# Patient Record
Sex: Female | Born: 1985 | Race: White | Hispanic: No | Marital: Married | Smoking: Current every day smoker
Health system: Southern US, Community
[De-identification: ages and names within clinical notes are randomized; demographics above are authoritative.]

## PROBLEM LIST (undated history)

## (undated) DIAGNOSIS — T7840XA Allergy, unspecified, initial encounter: Secondary | ICD-10-CM

## (undated) DIAGNOSIS — R51 Headache: Secondary | ICD-10-CM

## (undated) DIAGNOSIS — Z87442 Personal history of urinary calculi: Secondary | ICD-10-CM

## (undated) DIAGNOSIS — R519 Headache, unspecified: Secondary | ICD-10-CM

## (undated) DIAGNOSIS — F419 Anxiety disorder, unspecified: Secondary | ICD-10-CM

## (undated) DIAGNOSIS — Q7649 Other congenital malformations of spine, not associated with scoliosis: Secondary | ICD-10-CM

## (undated) HISTORY — PX: OTHER SURGICAL HISTORY: SHX169

## (undated) HISTORY — DX: Anxiety disorder, unspecified: F41.9

## (undated) HISTORY — DX: Allergy, unspecified, initial encounter: T78.40XA

---

## 2014-12-04 ENCOUNTER — Encounter: Payer: Self-pay | Admitting: Urgent Care

## 2014-12-04 ENCOUNTER — Emergency Department
Admission: EM | Admit: 2014-12-04 | Discharge: 2014-12-04 | Disposition: A | Discharge status: Home / Self Care | Attending: Emergency Medicine | Admitting: Emergency Medicine

## 2014-12-04 DIAGNOSIS — Q763 Congenital scoliosis due to congenital bony malformation: Secondary | ICD-10-CM | POA: Diagnosis not present

## 2014-12-04 DIAGNOSIS — Z720 Tobacco use: Secondary | ICD-10-CM | POA: Diagnosis not present

## 2014-12-04 DIAGNOSIS — R112 Nausea with vomiting, unspecified: Secondary | ICD-10-CM | POA: Diagnosis not present

## 2014-12-04 DIAGNOSIS — M545 Low back pain, unspecified: Secondary | ICD-10-CM

## 2014-12-04 DIAGNOSIS — R51 Headache: Secondary | ICD-10-CM | POA: Insufficient documentation

## 2014-12-04 DIAGNOSIS — Z760 Encounter for issue of repeat prescription: Secondary | ICD-10-CM | POA: Diagnosis present

## 2014-12-04 HISTORY — DX: Other congenital malformations of spine, not associated with scoliosis: Q76.49

## 2014-12-04 MED ORDER — HYDROCODONE-ACETAMINOPHEN 5-325 MG PO TABS: 1.0000 | ORAL_TABLET | ORAL | Status: DC | PRN

## 2014-12-04 MED ORDER — HYDROCODONE-ACETAMINOPHEN 5-325 MG PO TABS
2.0000 | ORAL_TABLET | Freq: Once | ORAL | Status: AC
Start: 2014-12-04 — End: 2014-12-04
  Administered 2014-12-04: 2 via ORAL
  Filled 2014-12-04: qty 2

## 2014-12-04 NOTE — Discharge Instructions (Signed)
Back Pain, Adult Low back pain is very common. About 1 in 5 people have back pain.The cause of low back pain is rarely dangerous. The pain often gets better over time.About half of people with a sudden onset of back pain feel better in just 2 weeks. About 8 in 10 people feel better by 6 weeks.  CAUSES Some common causes of back pain include:  Strain of the muscles or ligaments supporting the spine.  Wear and tear (degeneration) of the spinal discs.  Arthritis.  Direct injury to the back. DIAGNOSIS Most of the time, the direct cause of low back pain is not known.However, back pain can be treated effectively even when the exact cause of the pain is unknown.Answering your caregiver's questions about your overall health and symptoms is one of the most accurate ways to make sure the cause of your pain is not dangerous. If your caregiver needs more information, he or she may order lab work or imaging tests (X-rays or MRIs).However, even if imaging tests show changes in your back, this usually does not require surgery. HOME CARE INSTRUCTIONS For many people, back pain returns.Since low back pain is rarely dangerous, it is often a condition that people can learn to manageon their own.   Remain active. It is stressful on the back to sit or stand in one place. Do not sit, drive, or stand in one place for more than 30 minutes at a time. Take short walks on level surfaces as soon as pain allows.Try to increase the length of time you walk each day.  Do not stay in bed.Resting more than 1 or 2 days can delay your recovery.  Do not avoid exercise or work.Your body is made to move.It is not dangerous to be active, even though your back may hurt.Your back will likely heal faster if you return to being active before your pain is gone.  Pay attention to your body when you bend and lift. Many people have less discomfortwhen lifting if they bend their knees, keep the load close to their bodies,and  avoid twisting. Often, the most comfortable positions are those that put less stress on your recovering back.  Find a comfortable position to sleep. Use a firm mattress and lie on your side with your knees slightly bent. If you lie on your back, put a pillow under your knees.  Only take over-the-counter or prescription medicines as directed by your caregiver. Over-the-counter medicines to reduce pain and inflammation are often the most helpful.Your caregiver may prescribe muscle relaxant drugs.These medicines help dull your pain so you can more quickly return to your normal activities and healthy exercise.  Put ice on the injured area.  Put ice in a plastic bag.  Place a towel between your skin and the bag.  Leave the ice on for 15-20 minutes, 03-04 times a day for the first 2 to 3 days. After that, ice and heat may be alternated to reduce pain and spasms.  Ask your caregiver about trying back exercises and gentle massage. This may be of some benefit.  Avoid feeling anxious or stressed.Stress increases muscle tension and can worsen back pain.It is important to recognize when you are anxious or stressed and learn ways to manage it.Exercise is a great option. SEEK MEDICAL CARE IF:  You have pain that is not relieved with rest or medicine.  You have pain that does not improve in 1 week.  You have new symptoms.  You are generally not feeling well. SEEK   IMMEDIATE MEDICAL CARE IF:   You have pain that radiates from your back into your legs.  You develop new bowel or bladder control problems.  You have unusual weakness or numbness in your arms or legs.  You develop nausea or vomiting.  You develop abdominal pain.  You feel faint. Document Released: 03/09/2005 Document Revised: 09/08/2011 Document Reviewed: 07/11/2013 Exit[employer] Patient Information 2015 [employer]. This information is not intended to replace advice given to you by your health care provider. Make sure you  discuss any questions you have with your health care provider.  

## 2014-12-04 NOTE — ED Provider Notes (Signed)
[facility] Emergency Department Provider Note ____________________________________________  Time seen: Approximately 9:29 PM  I have reviewed the triage vital signs and the nursing notes.   HISTORY  Chief Complaint Medication Refill   HPI Kerri Fuentes is a 29 y.o. female presenting to the ED today for medication refill. She states that she takes hydrocodone-acetaminophen 10-325 mg 5 times a dayfor an established diagnosis of Bertolotti's syndrome. She may care from [place] 2 weeks ago and states that she has not yet established care. She states that she saw Dr. Leonette Monarch a spine specialist at [facility] who said that she would need to be referred to orthopedics. She also saw a PA in primary care who stated that she would need to come to the emergency department for her medication refill. She states she has been out of her medications for 2 days and has had 3 episodes of emesis today. She reports headache but denies fever, sweats, shaking or tremors. Her primary complaint today is lower back and bilateral hip pain which she rates at 8/10. She states she has tried Advil and Tylenol over-the-counter with no relief.   Past Medical History  Diagnosis Date  . Bertolotti's syndrome     There are no active problems to display for this patient.   History reviewed. No pertinent past surgical history.  Current Outpatient Rx  Name  Route  Sig  Dispense  Refill  . HYDROcodone-acetaminophen (NORCO) 5-325 MG per tablet   Oral   Take 1-2 tablets by mouth every 4 (four) hours as needed for moderate pain.   15 tablet   0     Allergies Other and Ultram  No family history on file.  Social History Social History  Substance Use Topics  . Smoking status: Current Every Day Smoker  . Smokeless tobacco: None  . Alcohol Use: No    Review of Systems Constitutional: No fever/chills Eyes: No visual changes. ENT: No sore throat. Cardiovascular: Denies chest  pain. Respiratory: Denies shortness of breath. Gastrointestinal: No abdominal pain.  Reports nausea and vomiting.  No diarrhea.  No constipation. Genitourinary: Negative for dysuria. Musculoskeletal: Positive for back pain. Skin: Negative for rash. Neurological: Positive for headache. Negative for focal weakness or numbness.  10-point ROS otherwise negative.  ____________________________________________   PHYSICAL EXAM:  VITAL SIGNS: ED Triage Vitals  Enc Vitals Group     BP 12/04/14 1956 150/103 mmHg     Pulse Rate 12/04/14 1956 99     Resp 12/04/14 1956 18     Temp 12/04/14 1956 98.2 F (36.8 C)     Temp Source 12/04/14 1956 Oral     SpO2 12/04/14 1956 96 %     Weight 12/04/14 1956 127 lb (57.607 kg)     Height 12/04/14 1956 5\' 8"  (1.727 m)     Head Cir --      Peak Flow --      Pain Score 12/04/14 1956 9     Pain Loc --      Pain Edu? --      Excl. in GC? --     Constitutional: Alert and oriented. Well appearing and in no acute distress. Eyes: Conjunctivae are normal. PERRL. EOMI. Head: Atraumatic. Nose: No congestion/rhinnorhea. Mouth/Throat: Mucous membranes are moist.  Oropharynx non-erythematous. Neck: No stridor.   Cardiovascular: Normal rate, regular rhythm. Grossly normal heart sounds.  Good peripheral circulation. Respiratory: Normal respiratory effort.  No retractions. Lungs CTAB. Gastrointestinal: Soft and nontender. No distention. No abdominal bruits.  No CVA tenderness. Musculoskeletal: Tenderness to palpation of lumbar spine. ROM limited due to pain.  Neurologic:  Normal speech and language. No gross focal neurologic deficits are appreciated. No gait instability. Skin:  Skin is warm, dry and intact. No rash noted. Psychiatric: Mood and affect are normal. Speech and behavior are normal.  ____________________________________________   LABS (all labs ordered are listed, but only abnormal results are displayed)  Labs Reviewed - No data to  display ____________________________________________    PROCEDURES  Procedure(s) performed: None  Critical Care performed: No  ____________________________________________   INITIAL IMPRESSION / ASSESSMENT AND PLAN / ED COURSE  Pertinent labs & imaging results that were available during my care of the patient were reviewed by me and considered in my medical decision making (see chart for details).  Patient presents with establish diagnosis of Bertolotti's syndrome for pain medication refill. Provided with two tablets of Hydocodone-acetaminophen 5-325 mg in the ED which reduced pain and improved nausea. Provided with prescription for Hydocodone-acetaminophen 10-325 mg for several days and encouraged to regulate medication to avoid withdrawal symptoms.   ____________________________________________   FINAL CLINICAL IMPRESSION(S) / ED DIAGNOSES  Final diagnoses:  Back pain at L4-L5 level      Evangeline Dakin, PA-C 12/04/14 2246  Loleta Rose, MD 12/04/14 [phone]

## 2014-12-04 NOTE — ED Notes (Signed)
Patient presents with c/o lower back and RIGHT hip pain. Reports that she just moved "home from [place]". Patient with PMH significant for Bertolotti's syndrome. Patient advising that she does not have a doctor - has been to two clinics that have told her that she would have to come to the ED for her narcotics until everything transferred from [place]. Patient asking for a HYDROcodone Rx in triage.

## 2014-12-25 ENCOUNTER — Ambulatory Visit: Payer: Self-pay | Admitting: Pain Medicine

## 2014-12-27 ENCOUNTER — Ambulatory Visit: Attending: Pain Medicine | Admitting: Pain Medicine

## 2014-12-27 ENCOUNTER — Encounter: Payer: Self-pay | Admitting: Pain Medicine

## 2014-12-27 VITALS — BP 138/86 | HR 95 | Temp 98.4°F | Resp 16 | Ht 68.0 in | Wt 130.0 lb

## 2014-12-27 DIAGNOSIS — M5416 Radiculopathy, lumbar region: Secondary | ICD-10-CM

## 2014-12-27 DIAGNOSIS — M5136 Other intervertebral disc degeneration, lumbar region: Secondary | ICD-10-CM | POA: Diagnosis not present

## 2014-12-27 DIAGNOSIS — M545 Low back pain: Secondary | ICD-10-CM | POA: Diagnosis present

## 2014-12-27 DIAGNOSIS — Q7649 Other congenital malformations of spine, not associated with scoliosis: Secondary | ICD-10-CM | POA: Diagnosis not present

## 2014-12-27 DIAGNOSIS — M533 Sacrococcygeal disorders, not elsewhere classified: Secondary | ICD-10-CM | POA: Diagnosis not present

## 2014-12-27 DIAGNOSIS — M47816 Spondylosis without myelopathy or radiculopathy, lumbar region: Secondary | ICD-10-CM

## 2014-12-27 NOTE — Progress Notes (Signed)
Subjective:    Patient ID: Kerri Fuentes, female    DOB: Jun 24, 1985, 29 y.o.   MRN: [id number]  HPI  patient is 29 year old female who comes to pain management Center at the request of Dr. Sherril Cong for further evaluation and treatment of pain involving the lower back and lower extremity regions. The patient states that her pain began approximately 6 years ago following the birth of her first baby girl. The patient stated proximate 2 weeks later she did begin to develop lower back pain. The patient stated that she had epidural anesthesia and did not feel that the epidural cause any of her pain. The patient states that she began to walk with a limp following the birth of her second baby girl approximately 3 years ago. The patient described her pain as aching cramping D Bill exhausting feeling of constriction nagging pressure-like sensation and pulsating pain. The patient stated the pain is a sharp shooting stabbing and throbbing sensation that was very uncomfortable. The patient stated the pain becomes more intense as the day progresses. Patient stated the pain is increased with bending kneeling lifting motion standing walking. Patient stated the pain is decreased with lying down. The patient has undergone previous treatment in pain clinic after coming to Pain Management Center and has undergone interventional treatment including radiofrequency rhizolysis. We discussed patient's condition and informed patient that we will consider patient for interventional treatment consisting of lumbar facet, medial branch nerve, blocks at time return appointment. The patient is undergone surgical evaluation by Dr. Sherril Cong  Without plans for surgical intervention. We will observe response to the  Lumbar facet, medial branch nerve, blocks and consider patient for additional modification of treatment pending response to treatment and follow-up evaluation. The patient was understanding and in agreement with  suggested treatment plan.      Review of Systems    Cardiovascular  Unremarkable   Pulmonary  Unremarkable   Neurological  Unremarkable   Psychological  Anxiety   Gastrointestinal  Unremarkable   Genitourinary  Unremarkable   Hematological  Unremarkable   Endocrine  Unremarkable   Rheumatological  Unremarkable   Musculoskeletal  Unremarkable   Other significant  Unremarkable      Objective:   Physical Exam   there was tends to palpation of paraspinal muscles region cervical region cervical facet region a minimal degree. There was minimal tenderness over the trapezius levator scapula rhomboid musculature regions. There was minimal tinnitus of the acromioclavicular and glenohumeral joint regions. Palpation over the cervical facet cervical paraspinal musculature region and the thoracic facet thoracic paraspinal musculature region were with minimal tends to palpation. Patient appeared to be with unremarkable Spurling's maneuver with bilaterally equal grip strength. Tinel and Phalen's maneuver were without increased pain of significant degree. Palpation over the lower thoracic paraspinal musculature region thoracic facet region was without significant increase of pain. There was no crepitus of the thoracic region noted . Palpation over the lumbar paraspinal muscles region lumbar facet region was a tends to palpation of moderate to moderately severe degree  and palpation over the PSIS and PII S regions was with moderately severe degree with tenderness to palpation as well  Period there was increase of pain with pressure prior to the ilium with patient in lateral decubitus position. Straight leg raising was tolerates approximately 30 without increased pain with dorsiflexion noted. DTRs appear to be trace at the knees. There was negative clonus negative Homans. No definite sensory deficit of dermatomal distribution  was detected. Lateral bending and rotation and extension and  palpation of the lumbar facets reproduce predominant portion of patient's pain. As well as palpation of the PSIS and PII S regions reproduced severe discomfort. Patient was also with positive Patrick's maneuver. There was negative clonus negative Homans. Abdomen the abdomen was nontender with no costovertebral angle tenderness noted.      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 Clint Guy for evaliation of  BP and general medical  condition  F/U surgical evaluation. Patient will follow-up with Dr. Sherril Cong as discussed and may be considered for further surgical evaluation  F/U neurological evaluation. May consider PNCV/EMG studies pending follow-up evaluations  May consider radiofrequency rhizolysis or intraspinal procedures pending response to present treatment and F/U evaluation   Patient to call Pain Management Center should patient have concerns prior to scheduled return appointment.

## 2014-12-27 NOTE — Patient Instructions (Addendum)
PLAN   Continue present medication   Lumbar facet, medial branch nerve blocks to be performed at time of return appointment  F/U PCP Dr. Clint Guy for evaliation of  BP and general medical  condition  F/U surgical evaluation. May consider pending follow-up evaluations  F/U neurological evaluation. May consider PNCV/EMG studies and other studies pending follow-up evaluations  May consider radiofrequency rhizolysis or intraspinal procedures pending response to present treatment and F/U evaluation   Patient to call Pain Management Center should patient have concerns prior to scheduled return appointment. Do not get a flu shot 2 weeks before or after your injection. Facet Blocks Patient Information  Description: The facets are joints in the spine between the vertebrae.  Like any joints in the body, facets can become irritated and painful.  Arthritis can also effect the facets.  By injecting steroids and local anesthetic in and around these joints, we can temporarily block the nerve supply to them.  Steroids act directly on irritated nerves and tissues to reduce selling and inflammation which often leads to decreased pain.  Facet blocks may be done anywhere along the spine from the neck to the low back depending upon the location of your pain.   After numbing the skin with local anesthetic (like Novocaine), a small needle is passed onto the facet joints under x-ray guidance.  You may experience a sensation of pressure while this is being done.  The entire block usually lasts about 15-25 minutes.   Conditions which may be treated by facet blocks:   Low back/buttock pain  Neck/shoulder pain  Certain types of headaches  Preparation for the injection:  1. Do not eat any solid food or dairy products within 6 hours of your appointment. 2. You may drink clear liquid up to 2 hours before appointment.  Clear liquids include water, black coffee, juice or soda.  No milk or  cream please. 3. You may take your regular medication, including pain medications, with a sip of water before your appointment.  Diabetics should hold regular insulin (if taken separately) and take 1/2 normal NPH dose the morning of the procedure.  Carry some sugar containing items with you to your appointment. 4. A driver must accompany you and be prepared to drive you home after your procedure. 5. Bring all your current medications with you. 6. An IV may be inserted and sedation may be given at the discretion of the physician. 7. A blood pressure cuff, EKG and other monitors will often be applied during the procedure.  Some patients may need to have extra oxygen administered for a short period. 8. You will be asked to provide medical information, including your allergies and medications, prior to the procedure.  We must know immediately if you are taking blood thinners (like Coumadin/Warfarin) or if you are allergic to IV iodine contrast (dye).  We must know if you could possible be pregnant.  Possible side-effects:   Bleeding from needle site  Infection (rare, may require surgery)  Nerve injury (rare)  Numbness & tingling (temporary)  Difficulty urinating (rare, temporary)  Spinal headache (a headache worse with upright posture)  Light-headedness (temporary)  Pain at injection site (serveral days)  Decreased blood pressure (rare, temporary)  Weakness in arm/leg (temporary)  Pressure sensation in back/neck (temporary)   Call if you experience:   Fever/chills associated with headache or increased back/neck pain  Headache worsened by an upright position  New onset, weakness or  numbness of an extremity below the injection site  Hives or difficulty breathing (go to the emergency room)  Inflammation or drainage at the injection site(s)  Severe back/neck pain greater than usual  New symptoms which are concerning to you  Please note:  Although the local anesthetic  injected can often make your back or neck feel good for several hours after the injection, the pain will likely return. It takes 3-7 days for steroids to work.  You may not notice any pain relief for at least one week.  If effective, we will often do a series of 2-3 injections spaced 3-6 weeks apart to maximally decrease your pain.  After the initial series, you may be a candidate for a more permanent nerve block of the facets.  If you have any questions, please call #[phone] [facility]

## 2014-12-28 ENCOUNTER — Encounter: Payer: Self-pay | Admitting: Pain Medicine

## 2015-01-03 ENCOUNTER — Other Ambulatory Visit: Payer: Self-pay | Admitting: Pain Medicine

## 2015-01-09 ENCOUNTER — Telehealth: Payer: Self-pay | Admitting: Pain Medicine

## 2015-01-09 ENCOUNTER — Encounter: Payer: Self-pay | Admitting: Pain Medicine

## 2015-01-09 ENCOUNTER — Ambulatory Visit: Attending: Pain Medicine | Admitting: Pain Medicine

## 2015-01-09 VITALS — BP 103/80 | HR 60 | Temp 98.2°F | Resp 14 | Ht 68.0 in | Wt 132.0 lb

## 2015-01-09 DIAGNOSIS — M5136 Other intervertebral disc degeneration, lumbar region: Secondary | ICD-10-CM | POA: Diagnosis not present

## 2015-01-09 DIAGNOSIS — M79604 Pain in right leg: Secondary | ICD-10-CM | POA: Diagnosis present

## 2015-01-09 DIAGNOSIS — M1288 Other specific arthropathies, not elsewhere classified, other specified site: Secondary | ICD-10-CM | POA: Diagnosis not present

## 2015-01-09 DIAGNOSIS — M545 Low back pain: Secondary | ICD-10-CM | POA: Diagnosis present

## 2015-01-09 DIAGNOSIS — M5416 Radiculopathy, lumbar region: Secondary | ICD-10-CM

## 2015-01-09 DIAGNOSIS — M533 Sacrococcygeal disorders, not elsewhere classified: Secondary | ICD-10-CM

## 2015-01-09 DIAGNOSIS — M79605 Pain in left leg: Secondary | ICD-10-CM | POA: Diagnosis present

## 2015-01-09 DIAGNOSIS — M47816 Spondylosis without myelopathy or radiculopathy, lumbar region: Secondary | ICD-10-CM

## 2015-01-09 MED ORDER — ORPHENADRINE CITRATE 30 MG/ML IJ SOLN
INTRAMUSCULAR | Status: AC
  Administered 2015-01-09: 60 mg
  Filled 2015-01-09: qty 2

## 2015-01-09 MED ORDER — TRIAMCINOLONE ACETONIDE 40 MG/ML IJ SUSP
INTRAMUSCULAR | Status: AC
  Administered 2015-01-09: 40 mg
  Filled 2015-01-09: qty 1

## 2015-01-09 MED ORDER — BUPIVACAINE HCL (PF) 0.25 % IJ SOLN
INTRAMUSCULAR | Status: AC
  Filled 2015-01-09: qty 30

## 2015-01-09 MED ORDER — FENTANYL CITRATE (PF) 100 MCG/2ML IJ SOLN
INTRAMUSCULAR | Status: AC
  Administered 2015-01-09: 100 ug via INTRAVENOUS
  Filled 2015-01-09: qty 2

## 2015-01-09 MED ORDER — LIDOCAINE HCL (PF) 1 % IJ SOLN
INTRAMUSCULAR | Status: AC
  Filled 2015-01-09: qty 5

## 2015-01-09 MED ORDER — MIDAZOLAM HCL 2 MG/2ML IJ SOLN: 5.0000 mg | Freq: Once | INTRAMUSCULAR | Status: DC

## 2015-01-09 MED ORDER — LIDOCAINE HCL (PF) 1 % IJ SOLN: 10.0000 mL | Freq: Once | INTRAMUSCULAR | Status: DC

## 2015-01-09 MED ORDER — TRIAMCINOLONE ACETONIDE 40 MG/ML IJ SUSP: 40.0000 mg | Freq: Once | INTRAMUSCULAR | Status: DC

## 2015-01-09 MED ORDER — FENTANYL CITRATE (PF) 100 MCG/2ML IJ SOLN: 100.0000 ug | Freq: Once | INTRAMUSCULAR | Status: DC

## 2015-01-09 MED ORDER — MIDAZOLAM HCL 5 MG/5ML IJ SOLN
INTRAMUSCULAR | Status: AC
  Administered 2015-01-09: 5 mg via INTRAVENOUS
  Filled 2015-01-09: qty 5

## 2015-01-09 MED ORDER — SODIUM CHLORIDE 0.9 % IJ SOLN: 20.0000 mL | Freq: Once | INTRAMUSCULAR | Status: DC

## 2015-01-09 MED ORDER — ORPHENADRINE CITRATE 30 MG/ML IJ SOLN: 60.0000 mg | Freq: Once | INTRAMUSCULAR | Status: DC

## 2015-01-09 MED ORDER — BUPIVACAINE HCL (PF) 0.25 % IJ SOLN
INTRAMUSCULAR | Status: AC
  Administered 2015-01-09: 30 mL
  Filled 2015-01-09: qty 30

## 2015-01-09 MED ORDER — SODIUM CHLORIDE 0.9 % IJ SOLN
INTRAMUSCULAR | Status: AC
  Filled 2015-01-09: qty 20

## 2015-01-09 MED ORDER — BUPIVACAINE HCL (PF) 0.25 % IJ SOLN: 30.0000 mL | Freq: Once | INTRAMUSCULAR | Status: DC

## 2015-01-09 MED ORDER — LACTATED RINGERS IV SOLN: 1000.0000 mL | INTRAVENOUS | Status: DC

## 2015-01-09 NOTE — Progress Notes (Signed)
Safety precautions to be maintained throughout the outpatient stay will include: orient to surroundings, keep bed in low position, maintain call bell within reach at all times, provide assistance with transfer out of bed and ambulation.  

## 2015-01-09 NOTE — Patient Instructions (Addendum)
PLAN   Continue present medication  F/U PCP  Dr. Clint GuyLindley for evaliation of  BP and general medical  condition  F/U surgical evaluation. May consider pending follow-up evaluations  F/U neurological evaluation. May consider pending follow-up evaluations  May consider radiofrequency rhizolysis or intraspinal procedures pending response to present treatment and F/U evaluation   Patient to call Pain Management Center should patient have concerns prior to scheduled return appointment.  Pain Management Discharge Instructions  General Discharge Instructions :  If you need to reach your doctor call: Monday-Friday 8:00 am - 4:00 pm at [phone] or toll free [phone].  After clinic hours [phone] to have operator reach doctor.  Bring all of your medication bottles to all your appointments in the pain clinic.  To cancel or reschedule your appointment with Pain Management please remember to call 24 hours in advance to avoid a fee.  Refer to the educational materials which you have been given on: General Risks, I had my Procedure. Discharge Instructions, Post Sedation.  Post Procedure Instructions:  The drugs you were given will stay in your system until tomorrow, so for the next 24 hours you should not drive, make any legal decisions or drink any alcoholic beverages.  You may eat anything you prefer, but it is better to start with liquids then soups and crackers, and gradually work up to solid foods.  Please notify your doctor immediately if you have any unusual bleeding, trouble breathing or pain that is not related to your normal pain.  Depending on the type of procedure that was done, some parts of your body may feel week and/or numb.  This usually clears up by tonight or the next day.  Walk with the use of an assistive device or accompanied by an adult for the 24 hours.  You may use ice on the affected area for the first 24 hours.  Put ice in a Ziploc bag and cover with a  towel and place against area 15 minutes on 15 minutes off.  You may switch to heat after 24 hours.

## 2015-01-09 NOTE — Telephone Encounter (Signed)
Nurses  Dutch QuintShatoya and Olegario MessierKathy  Please schedule patient for your appointment before October 28 Please ask patient to come at 7 AM for 7:30 AM appointment

## 2015-01-09 NOTE — Telephone Encounter (Signed)
Will be out of meds on 28th / not sched until 11-15 wants to come in before the 28th / do you want her appt moved up

## 2015-01-09 NOTE — Progress Notes (Signed)
Subjective:    Patient ID: Kerri Fuentes, female    DOB: September 14, 1985, 29 y.o.   MRN: [id number]  HPI PROCEDURE PERFORMED: Lumbar facet (medial branch block)   NOTE: The patient is a 29 y.o. female who [facility] and treatment of pain involving the lumbar and lower extremity region. 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 There is concern regarding significant component of patient's pain being due to facet arthropathy and facet syndrome The risks, benefits, and expectations of the procedure have been discussed and explained to the patient who was understanding and in agreement with suggested treatment plan. We will proceed with interventional treatment as discussed and as explained to the patient who was understanding and wished to proceed with procedure as planned.   DESCRIPTION OF PROCEDURE: Lumbar facet (medial branch block) with IV Versed, IV fentanyl conscious sedation, EKG, blood pressure, pulse, and pulse oximetry monitoring. The procedure was performed with the patient in the prone position. Betadine prep of proposed entry site performed.   NEEDLE PLACEMENT AT: Left L 3 lumbar facet (medial branch block). Under fluoroscopic guidance with oblique orientation of 15 degrees, a 22-gauge needle was inserted at the L 3 vertebral body level with needle placed at the targeted area of Burton's Eye or Eye of the Scotty Dog with documentation of needle placement in the superior and lateral border of targeted area of Burton's Eye or Eye of the Scotty Dog with oblique orientation of 15 degrees. Following documentation of needle placement at the L 3 vertebral body level, needle placement was then accomplished at the L 4 vertebral body level.   NEEDLE PLACEMENT AT L4 and L5 VERTEBRAL  BODY LEVELS ON THE LEFT SIDE The procedure was performed at the L4 and L5 vertebral body levels exactly as was performed at the L 3 vertebral body level utilizing the same technique and under fluoroscopic guidance.  NEEDLE PLACEMENT AT THE SACRAL ALA with AP view of the lumbosacral spine. With the patient in the prone position, Betadine prep of proposed entry site accomplished, a 22 gauge needle was inserted in the region of the sacral ala (groove formed by the superior articulating process of S1 and the sacral wing). Following documentation of needle placement at the sacral ala,  needle placement was then accomplished at the S1 foramen level.   NEEDLE PLACEMENT AT THE S1 FORAMEN LEVEL under fluoroscopic guidance with AP view of the lumbosacral spine and cephalad orientation of the fluoroscope, a 22-gauge needle was placed at the superior and lateral border of the S1 foramen under fluoroscopic guidance. Following documentation of needle placement at the S1 foramen.   Needle placement was then verified at all levels on lateral view. Following documentation of needle placement at all levels on lateral view and following negative aspiration for heme and CSF, each level was injected with 1 mL of 0.25% bupivacaine with Kenalog.     LUMBAR FACET, MEDIAL BRANCH NERVE, BLOCKS PERFORMED ON THE RIGHT SIDE   The procedure was performed on the right side exactly as was performed on the left side at the same levels and utilizing the same technique under fluoroscopic guidance.  Myoneural block injections of the gluteal musculature region Following Betadine prep proposed entry site a 22-gauge needle was inserted in the gluteal musculature region and following negative aspiration  to cc of 0.25% bupivacaine with Norflex was injected for myoneural block injection 2    The patient tolerated the procedure well. A total of 40 mg of Kenalog was utilized for the procedure.   PLAN:  1. Medications: The patient  will continue presently prescribed medications. 2. May consider modification of treatment regimen at time of return appointment pending response to treatment rendered on today's visit. 3. The patient is to follow-up with primary care physician Dr. Clint GuyLindley for further evaluation of blood pressure and general medical condition status post steroid injection performed on today's visit. 4. Surgical follow-up evaluation. 5. Neurological follow-up evaluation. 6. The patient may be candidate for radiofrequency procedures, implantation type procedures, and other treatment pending response to treatment and follow-up evaluation. 7. The patient has been advised to call the [facility] prior to scheduled return appointment should there be significant change in condition or should patient have other concerns regarding condition prior to scheduled return appointment.  The patient is understanding and in agreement with suggested treatment plan.    Review of Systems     Objective:   Physical Exam        Assessment & Plan:

## 2015-01-10 NOTE — Telephone Encounter (Signed)
No problems post procedure. 

## 2015-01-14 ENCOUNTER — Ambulatory Visit: Payer: Self-pay | Admitting: Pain Medicine

## 2015-01-15 ENCOUNTER — Other Ambulatory Visit: Payer: Self-pay | Admitting: Pain Medicine

## 2015-01-15 ENCOUNTER — Telehealth: Payer: Self-pay | Admitting: Pain Medicine

## 2015-01-15 ENCOUNTER — Ambulatory Visit: Attending: Pain Medicine | Admitting: Pain Medicine

## 2015-01-15 ENCOUNTER — Encounter: Payer: Self-pay | Admitting: Pain Medicine

## 2015-01-15 VITALS — BP 122/74 | HR 107 | Temp 98.0°F | Resp 15 | Ht 68.0 in | Wt 130.0 lb

## 2015-01-15 DIAGNOSIS — M5416 Radiculopathy, lumbar region: Secondary | ICD-10-CM | POA: Insufficient documentation

## 2015-01-15 DIAGNOSIS — Z79899 Other long term (current) drug therapy: Secondary | ICD-10-CM | POA: Insufficient documentation

## 2015-01-15 DIAGNOSIS — Q7649 Other congenital malformations of spine, not associated with scoliosis: Secondary | ICD-10-CM | POA: Insufficient documentation

## 2015-01-15 DIAGNOSIS — M47816 Spondylosis without myelopathy or radiculopathy, lumbar region: Secondary | ICD-10-CM

## 2015-01-15 DIAGNOSIS — M5136 Other intervertebral disc degeneration, lumbar region: Secondary | ICD-10-CM | POA: Insufficient documentation

## 2015-01-15 DIAGNOSIS — M533 Sacrococcygeal disorders, not elsewhere classified: Secondary | ICD-10-CM

## 2015-01-15 MED ORDER — IBUPROFEN 800 MG PO TABS: ORAL_TABLET | ORAL | Status: DC

## 2015-01-15 MED ORDER — HYDROCODONE-ACETAMINOPHEN 10-325 MG PO TABS: ORAL_TABLET | ORAL | Status: DC

## 2015-01-15 MED ORDER — BACLOFEN 20 MG PO TABS: ORAL_TABLET | ORAL | Status: DC

## 2015-01-15 NOTE — Progress Notes (Signed)
Safety precautions to be maintained throughout the outpatient stay will include: orient to surroundings, keep bed in low position, maintain call bell within reach at all times, provide assistance with transfer out of bed and ambulation.  

## 2015-01-15 NOTE — Progress Notes (Signed)
Subjective:    Patient ID: Kerri Fuentes, female    DOB: 05/31/1985, 29 y.o.   MRN: [id number]  HPI  Patient is 29 year old female [facility] evaluation and treatment of pain involving the lower back and lower extremity region. The patient states that she continues to be with significant pain of the lower back region with pain radiating from the lumbar region for the buttocks. Patient's pain is aggravated by standing walking twisting turning maneuvers and is also aggravated with climbing stairs to significant degree. The patient denies any trauma change in events of daily living the call significant change in symptoms pathology. We discussed patient's condition on today's visit and we'll request permission to prescribed medications for treatment of patient's pain. We will also consider patient for block of nerves to the sacroiliac joint at time return appointment. Patient is with findings suggestive of significant sacroiliac joint discomfort and dysfunction. We will consider modification of treatment regimen pending response to treatment and follow-up evaluation. We will we will proceed with what appears to be medically necessary procedure block of nerves to the sacroiliac joint in attempt to decrease severity of symptoms, minimize progression of symptoms, and avoid need for more involved treatment. The patient was in agreement with suggested treatment plan    Review of Systems     Objective:   Physical Exam  There was tenderness over the splenius capitis and occipitalis musculature region a mild degree with mild tenderness of the cervical facet cervical paraspinal musculature region. Patient appeared to be with bilaterally equal grip strength with Tinel and Phalen's maneuver reproducing minimal discomfort. Palpation of the thoracic facet thoracic paraspinal musculature region was a tends to palpation of mild degree with evidence of mild to moderate  muscle spasms in the thoracic paraspinal musculature region the mid and lower sections of the thoracic region. No crepitus of the thoracic region was noted. Palpation over the lumbar paraspinal muscle lumbar facet region associated with increased pain with extension and palpation of the lumbar facets reproducing moderate discomfort. There was moderate to moderately severe tenderness to palpation of the PSIS and PII S region and Patrick's maneuver was with increased pain and performed on the right especially. EHL strength appeared to be slightly decreased and no definite sensory deficit of dermatomal region was detected.. There was negative clonus negative Homans. There was mild tends to palpation of the greater trochanteric region and iliotibial band region. Abdomen was nontender with no costovertebral angle tenderness noted.      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 Medications: The patient will continue presently prescribed medications baclofen ibuprofen and hydrocodone acetaminophen. The patient was given prescription for these medications on today's visit 2. Block of nerves to the sacroiliac joint to be performed at time return appointment. 3. May consider modification of treatment regimen at time of return appointment pending response to treatment rendered on today's visit. 4. The patient is to follow-up with primary care physician Dr. Clint GuyLindley for further evaluation of blood pressure and general medical condition  5. Surgical follow-up evaluation. We will consider as discussed 6. Neurological follow-up evaluation. We will consider as discussed 7. The patient may be candidate for radiofrequency procedures, implantation type procedures, and other  treatment pending response to treatment and follow-up evaluation. 8. The patient has been advised to call the [facility] prior to scheduled return appointment should there be significant change in condition or should patient have other concerns regarding condition prior to scheduled return appointment.

## 2015-01-15 NOTE — Telephone Encounter (Signed)
She says meds were supposed to be increased and when she got home with them they are the same as before / please call

## 2015-01-15 NOTE — Telephone Encounter (Signed)
Per dr crisp patient was started on baseline of 4/day and dr crisp will re eval that at next visit. Hoping that procedures+meds will be able to keep meds at 4/day. Patient understands.

## 2015-01-15 NOTE — Patient Instructions (Addendum)
Plan   Continue present medications baclofen ibuprofen and hydrocodone acetaminophen  Block of nerves to the sacroiliac joint to be performed at time return appointment  F/U PCP Dr. Clint Guy for evaliation of  BP and general medical  condition  F/U surgical evaluation. May consider pending follow-up evaluations  F/U neurological evaluation. May consider PNCV/EMG studies and other studies pending follow-up evaluations  May consider radiofrequency rhizolysis or intraspinal procedures pending response to present treatment and F/U evaluation   Patient to call [facility] should patient have concerns prior to scheduled return appointment.GENERAL RISKS AND COMPLICATIONS  What are the risk, side effects and possible complications? Generally speaking, most procedures are safe.  However, with any procedure there are risks, side effects, and the possibility of complications.  The risks and complications are dependent upon the sites that are lesioned, or the type of nerve block to be performed.  The closer the procedure is to the spine, the more serious the risks are.  Great care is taken when placing the radio frequency needles, block needles or lesioning probes, but sometimes complications can occur. 1. Infection: Any time there is an injection through the skin, there is a risk of infection.  This is why sterile conditions are used for these blocks.  There are four possible types of infection. 1. Localized skin infection. 2. Central Nervous System Infection-This can be in the form of Meningitis, which can be deadly. 3. Epidural Infections-This can be in the form of an epidural abscess, which can cause pressure inside of the spine, causing compression of the spinal cord with subsequent paralysis. This would require an emergency surgery to decompress, and there are no guarantees that the patient would recover from the paralysis. 4. Discitis-This is an infection of the intervertebral discs.  It  occurs in about 1% of discography procedures.  It is difficult to treat and it may lead to surgery.        2. Pain: the needles have to go through skin and soft tissues, will cause soreness.       3. Damage to internal structures:  The nerves to be lesioned may be near blood vessels or    other nerves which can be potentially damaged.       4. Bleeding: Bleeding is more common if the patient is taking blood thinners such as  aspirin, Coumadin, Ticiid, Plavix, etc., or if he/she have some genetic predisposition  such as hemophilia. Bleeding into the spinal canal can cause compression of the spinal  cord with subsequent paralysis.  This would require an emergency surgery to  decompress and there are no guarantees that the patient would recover from the  paralysis.       5. Pneumothorax:  Puncturing of a lung is a possibility, every time a needle is introduced in  the area of the chest or upper back.  Pneumothorax refers to free air around the  collapsed lung(s), inside of the thoracic cavity (chest cavity).  Another two possible  complications related to a similar event would include: Hemothorax and Chylothorax.   These are variations of the Pneumothorax, where instead of air around the collapsed  lung(s), you may have blood or chyle, respectively.       6. Spinal headaches: They may occur with any procedures in the area of the spine.       7. Persistent CSF (Cerebro-Spinal Fluid) leakage: This is a rare problem, but may occur  with prolonged intrathecal or epidural catheters either due to  the formation of a fistulous  track or a dural tear.       8. Nerve damage: By working so close to the spinal cord, there is always a possibility of  nerve damage, which could be as serious as a permanent spinal cord injury with  paralysis.       9. Death:  Although rare, severe deadly allergic reactions known as "Anaphylactic  reaction" can occur to any of the medications used.      10. Worsening of the symptoms:  We can  always make thing worse.  What are the chances of something like this happening? Chances of any of this occuring are extremely low.  By statistics, you have more of a chance of getting killed in a motor vehicle accident: while driving to the hospital than any of the above occurring .  Nevertheless, you should be aware that they are possibilities.  In general, it is similar to taking a shower.  Everybody knows that you can slip, hit your head and get killed.  Does that mean that you should not shower again?  Nevertheless always keep in mind that statistics do not mean anything if you happen to be on the wrong side of them.  Even if a procedure has a 1 (one) in a 1,000,000 (million) chance of going wrong, it you happen to be that one..Also, keep in mind that by statistics, you have more of a chance of having something go wrong when taking medications.  Who should not have this procedure? If you are on a blood thinning medication (e.g. Coumadin, Plavix, see list of "Blood Thinners"), or if you have an active infection going on, you should not have the procedure.  If you are taking any blood thinners, please inform your physician.  How should I prepare for this procedure?  Do not eat or drink anything at least six hours prior to the procedure.  Bring a driver with you .  It cannot be a taxi.  Come accompanied by an adult that can drive you back, and that is strong enough to help you if your legs get weak or numb from the local anesthetic.  Take all of your medicines the morning of the procedure with just enough water to swallow them.  If you have diabetes, make sure that you are scheduled to have your procedure done first thing in the morning, whenever possible.  If you have diabetes, take only half of your insulin dose and notify our nurse that you have done so as soon as you arrive at the clinic.  If you are diabetic, but only take blood sugar pills (oral hypoglycemic), then do not take them on  the morning of your procedure.  You may take them after you have had the procedure.  Do not take aspirin or any aspirin-containing medications, at least eleven (11) days prior to the procedure.  They may prolong bleeding.  Wear loose fitting clothing that may be easy to take off and that you would not mind if it got stained with Betadine or blood.  Do not wear any jewelry or perfume  Remove any nail coloring.  It will interfere with some of our monitoring equipment.  NOTE: Remember that this is not meant to be interpreted as a complete list of all possible complications.  Unforeseen problems may occur.  BLOOD THINNERS The following drugs contain aspirin or other products, which can cause increased bleeding during surgery and should not be taken for 2 weeks prior  to and 1 week after surgery.  If you should need take something for relief of minor pain, you may take acetaminophen which is found in Tylenol,m Datril, Anacin-3 and Panadol. It is not blood thinner. The products listed below are.  Do not take any of the products listed below in addition to any listed on your instruction sheet.  A.P.C or A.P.C with Codeine Codeine Phosphate Capsules #3 Ibuprofen Ridaura  ABC compound Congesprin Imuran rimadil  Advil Cope Indocin Robaxisal  Alka-Seltzer Effervescent Pain Reliever and Antacid Coricidin or Coricidin-D  Indomethacin Rufen  Alka-Seltzer plus Cold Medicine Cosprin Ketoprofen S-A-C Tablets  Anacin Analgesic Tablets or Capsules Coumadin Korlgesic Salflex  Anacin Extra Strength Analgesic tablets or capsules CP-2 Tablets Lanoril Salicylate  Anaprox Cuprimine Capsules Levenox Salocol  Anexsia-D Dalteparin Magan Salsalate  Anodynos Darvon compound Magnesium Salicylate Sine-off  Ansaid Dasin Capsules Magsal Sodium Salicylate  Anturane Depen Capsules Marnal Soma  APF Arthritis pain formula Dewitt's Pills Measurin Stanback  Argesic Dia-Gesic Meclofenamic Sulfinpyrazone  Arthritis Bayer Timed  Release Aspirin Diclofenac Meclomen Sulindac  Arthritis pain formula Anacin Dicumarol Medipren Supac  Analgesic (Safety coated) Arthralgen Diffunasal Mefanamic Suprofen  Arthritis Strength Bufferin Dihydrocodeine Mepro Compound Suprol  Arthropan liquid Dopirydamole Methcarbomol with Aspirin Synalgos  ASA tablets/Enseals Disalcid Micrainin Tagament  Ascriptin Doan's Midol Talwin  Ascriptin A/D Dolene Mobidin Tanderil  Ascriptin Extra Strength Dolobid Moblgesic Ticlid  Ascriptin with Codeine Doloprin or Doloprin with Codeine Momentum Tolectin  Asperbuf Duoprin Mono-gesic Trendar  Aspergum Duradyne Motrin or Motrin IB Triminicin  Aspirin plain, buffered or enteric coated Durasal Myochrisine Trigesic  Aspirin Suppositories Easprin Nalfon Trillsate  Aspirin with Codeine Ecotrin Regular or Extra Strength Naprosyn Uracel  Atromid-S Efficin Naproxen Ursinus  Auranofin Capsules Elmiron Neocylate Vanquish  Axotal Emagrin Norgesic Verin  Azathioprine Empirin or Empirin with Codeine Normiflo Vitamin E  Azolid Emprazil Nuprin Voltaren  Bayer Aspirin plain, buffered or children's or timed BC Tablets or powders Encaprin Orgaran Warfarin Sodium  Buff-a-Comp Enoxaparin Orudis Zorpin  Buff-a-Comp with Codeine Equegesic Os-Cal-Gesic   Buffaprin Excedrin plain, buffered or Extra Strength Oxalid   Bufferin Arthritis Strength Feldene Oxphenbutazone   Bufferin plain or Extra Strength Feldene Capsules Oxycodone with Aspirin   Bufferin with Codeine Fenoprofen Fenoprofen Pabalate or Pabalate-SF   Buffets II Flogesic Panagesic   Buffinol plain or Extra Strength Florinal or Florinal with Codeine Panwarfarin   Buf-Tabs Flurbiprofen Penicillamine   Butalbital Compound Four-way cold tablets Penicillin   Butazolidin Fragmin Pepto-Bismol   Carbenicillin Geminisyn Percodan   Carna Arthritis Reliever Geopen Persantine   Carprofen Gold's salt Persistin   Chloramphenicol Goody's Phenylbutazone   Chloromycetin  [place]rain Piroxlcam   Clmetidine heparin Plaquenil   Cllnoril Hyco-pap Ponstel   Clofibrate Hydroxy chloroquine Propoxyphen         Before stopping any of these medications, be sure to consult the physician who ordered them.  Some, such as Coumadin (Warfarin) are ordered to prevent or treat serious conditions such as "deep thrombosis", "pumonary embolisms", and other heart problems.  The amount of time that you may need off of the medication may also vary with the medication and the reason for which you were taking it.  If you are taking any of these medications, please make sure you notify your pain physician before you undergo any procedures.         Pain Management Discharge Instructions  General Discharge Instructions :  If you need to reach your doctor call: Monday-Friday 8:00 am - 4:00 pm at [phone] or  toll free [phone].  After clinic hours [phone] to have operator reach doctor.  Bring all of your medication bottles to all your appointments in the pain clinic.  To cancel or reschedule your appointment with Pain Management please remember to call 24 hours in advance to avoid a fee.  Refer to the educational materials which you have been given on: General Risks, I had my Procedure. Discharge Instructions, Post Sedation.  Post Procedure Instructions:  The drugs you were given will stay in your system until tomorrow, so for the next 24 hours you should not drive, make any legal decisions or drink any alcoholic beverages.  You may eat anything you prefer, but it is better to start with liquids then soups and crackers, and gradually work up to solid foods.  Please notify your doctor immediately if you have any unusual bleeding, trouble breathing or pain that is not related to your normal pain.  Depending on the type of procedure that was done, some parts of your body may feel week and/or numb.  This usually clears up by tonight or the next day.  Walk with the use  of an assistive device or accompanied by an adult for the 24 hours.  You may use ice on the affected area for the first 24 hours.  Put ice in a Ziploc bag and cover with a towel and place against area 15 minutes on 15 minutes off.  You may switch to heat after 24 hours.Sacroiliac (SI) Joint Injection Patient Information  Description: The sacroiliac joint connects the scrum (very low back and tailbone) to the ilium (a pelvic bone which also forms half of the hip joint).  Normally this joint experiences very little motion.  When this joint becomes inflamed or unstable low back and or hip and pelvis pain may result.  Injection of this joint with local anesthetics (numbing medicines) and steroids can provide diagnostic information and reduce pain.  This injection is performed with the aid of x-ray guidance into the tailbone area while you are lying on your stomach.   You may experience an electrical sensation down the leg while this is being done.  You may also experience numbness.  We also may ask if we are reproducing your normal pain during the injection.  Conditions which may be treated SI injection:   Low back, buttock, hip or leg pain  Preparation for the Injection:  1. Do not eat any solid food or dairy products within 6 hours of your appointment.  2. You may drink clear liquids up to 2 hours before appointment.  Clear liquids include water, black coffee, juice or soda.  No milk or cream please. 3. You may take your regular medications, including pain medications with a sip of water before your appointment.  Diabetics should hold regular insulin (if take separately) and take 1/2 normal NPH dose the morning of the procedure.  Carry some sugar containing items with you to your appointment. 4. A driver must accompany you and be prepared to drive you home after your procedure. 5. Bring all of your current medications with you. 6. An IV may be inserted and sedation may be given at the discretion of  the physician. 7. A blood pressure cuff, EKG and other monitors will often be applied during the procedure.  Some patients may need to have extra oxygen administered for a short period.  8. You will be asked to provide medical information, including your allergies, prior to the procedure.  We must know  immediately if you are taking blood thinners (like Coumadin/Warfarin) or if you are allergic to IV iodine contrast (dye).  We must know if you could possible be pregnant.  Possible side effects:   Bleeding from needle site  Infection (rare, may require surgery)  Nerve injury (rare)  Numbness & tingling (temporary)  A brief convulsion or seizure  Light-headedness (temporary)  Pain at injection site (several days)  Decreased blood pressure (temporary)  Weakness in the leg (temporary)   Call if you experience:   New onset weakness or numbness of an extremity below the injection site that last more than 8 hours.  Hives or difficulty breathing ( go to the emergency room)  Inflammation or drainage at the injection site  Any new symptoms which are concerning to you  Please note:  Although the local anesthetic injected can often make your back/ hip/ buttock/ leg feel good for several hours after the injections, the pain will likely return.  It takes 3-7 days for steroids to work in the sacroiliac area.  You may not notice any pain relief for at least that one week.  If effective, we will often do a series of three injections spaced 3-6 weeks apart to maximally decrease your pain.  After the initial series, we generally will wait some months before a repeat injection of the same type.  If you have any questions, please call [phone] [facility]

## 2015-01-23 ENCOUNTER — Ambulatory Visit: Attending: Pain Medicine | Admitting: Pain Medicine

## 2015-01-23 ENCOUNTER — Encounter: Payer: Self-pay | Admitting: Pain Medicine

## 2015-01-23 VITALS — BP 95/69 | HR 80 | Resp 16

## 2015-01-23 DIAGNOSIS — M79605 Pain in left leg: Secondary | ICD-10-CM | POA: Diagnosis present

## 2015-01-23 DIAGNOSIS — M79604 Pain in right leg: Secondary | ICD-10-CM | POA: Diagnosis present

## 2015-01-23 DIAGNOSIS — M5136 Other intervertebral disc degeneration, lumbar region: Secondary | ICD-10-CM | POA: Insufficient documentation

## 2015-01-23 DIAGNOSIS — M533 Sacrococcygeal disorders, not elsewhere classified: Secondary | ICD-10-CM

## 2015-01-23 DIAGNOSIS — M545 Low back pain: Secondary | ICD-10-CM | POA: Diagnosis present

## 2015-01-23 DIAGNOSIS — M5416 Radiculopathy, lumbar region: Secondary | ICD-10-CM

## 2015-01-23 DIAGNOSIS — M47816 Spondylosis without myelopathy or radiculopathy, lumbar region: Secondary | ICD-10-CM

## 2015-01-23 MED ORDER — MIDAZOLAM HCL 5 MG/5ML IJ SOLN
INTRAMUSCULAR | Status: AC
  Administered 2015-01-23: 5 mg via INTRAVENOUS
  Filled 2015-01-23: qty 5

## 2015-01-23 MED ORDER — FENTANYL CITRATE (PF) 100 MCG/2ML IJ SOLN
INTRAMUSCULAR | Status: AC
  Administered 2015-01-23: 100 ug via INTRAVENOUS
  Filled 2015-01-23: qty 2

## 2015-01-23 MED ORDER — ORPHENADRINE CITRATE 30 MG/ML IJ SOLN
INTRAMUSCULAR | Status: AC
  Filled 2015-01-23: qty 2

## 2015-01-23 MED ORDER — LIDOCAINE HCL (PF) 1 % IJ SOLN
INTRAMUSCULAR | Status: AC
  Filled 2015-01-23: qty 5

## 2015-01-23 MED ORDER — TRIAMCINOLONE ACETONIDE 40 MG/ML IJ SUSP
INTRAMUSCULAR | Status: AC
  Administered 2015-01-23: 40 mg
  Filled 2015-01-23: qty 1

## 2015-01-23 MED ORDER — BUPIVACAINE HCL (PF) 0.25 % IJ SOLN
INTRAMUSCULAR | Status: AC
  Administered 2015-01-23: 20 mL
  Filled 2015-01-23: qty 30

## 2015-01-23 NOTE — Progress Notes (Signed)
Subjective:    Patient ID: Kerri Fuentes, female    DOB: 05/24/1985, 29 y.o.   MRN: [id number]  HPI  PROCEDURE:  Block of nerves to the sacroiliac joint.   NOTE:  The patient is a 29 y.o. female who returns to the [facility] for further evaluation and treatment of pain involving the lower back and lower extremity region with pain in the region of the buttocks as well. 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  the patient is with reproduction of severe pain with palpation of the PSIS and PII S regions There is concern regarding a significant component of the patient's pain being due to sacroiliac joint dysfunction The risks, benefits, expectations of the procedure have been discussed and explained to the patient who is understanding and willing to proceed with interventional treatment in attempt to decrease severity of patient's symptoms, minimize the risk of medication escalation and  hopefully retard the progression of the patient's symptoms. We will proceed with what is felt to be a medically necessary procedure, block of nerves to the sacroiliac joint.   DESCRIPTION OF PROCEDURE:  Block of nerves to the sacroiliac joint.   The patient was taken to the fluoroscopy suite. With the patient in the prone position with EKG, blood pressure, pulse and pulse oximetry monitoring, IV Versed, IV fentanyl conscious sedation, Betadine prep of proposed entry site was performed.   Block of nerves at the L5 vertebral body level.   With the patient in prone position, under fluoroscopic guidance, a 22 -gauge needle was inserted at the L5 vertebral body level on the left side. With 15 degrees oblique orientation a 22 -gauge needle was inserted in the region known as Burton's eye or eye of the Scotty dog. Following documentation of needle placement  in the area of Burton's eye or eye of the Scotty dog under fluoroscopic guidance, needle placement was then accomplished at the sacral ala level on the left side.   Needle placement at the sacral ala.   With the patient in prone position under fluoroscopic guidance with AP view of the lumbosacral spine, a 22 -gauge needle was inserted in the region known as the sacral ala on the left side. Following documentation of needle placement on the left side under fluoroscopic guidance needle placement was then accomplished at the S1 foramen level.   Needle placement at the S1 foramen level.   With the patient in prone position under fluoroscopic guidance with AP view of the lumbosacral spine and cephalad orientation, a 22 -gauge needle was inserted at the superior and lateral border of the S1 foramen on the left side. Following documentation of needle placement at the S1 foramen level on the left side, needle placement was then accomplished at the S2 foramen level on the left side.   Needle placement at the S2 foramen level.   With the patient in prone position with AP view of the lumbosacral spine with cephalad orientation, a 22 - gauge needle was inserted at the superior and lateral border of the S2 foramen under fluoroscopic guidance on the left side. Following needle placement at the L5 vertebral body level, sacral ala, S1 foramen and S2 foramen on the left left side, needle placement was verified on lateral view under fluoroscopic guidance.  Following needle placement documentation on lateral view, each needle was injected with 1 mL  of 0.25% bupivacaine and Kenalog.   BLOCK OF THE NERVES TO SACROILIAC JOINT ON THE RIGHT SIDE The procedure was performed on the right side at the same levels as was performed on the left side and utilizing the same technique as on the left side and was performed under fluoroscopic guidance as on the left side   A total of  of Kenalog was utilized for the procedure.    PLAN:  1. Medications: The patient will continue presently prescribed medications baclofen ibuprofen and hydrocodone acetaminophen  2. The patient will be considered for modification of treatment regimen pending response to the procedure performed on today's visit.  3. The patient is to follow-up with primary care physician Dr. Larita Fife for evaluation of blood pressure and general medical condition following the procedure performed on today's visit.  4. Surgical evaluation as discussed.  5. Neurological evaluation as discussed.  6. The patient may be a candidate for radiofrequency procedures, implantation devices and other treatment pending response to treatment performed on today's visit and follow-up evaluation.  7. The patient has been advised to adhere to proper body mechanics and to avoid activities which may exacerbate the patient's symptoms.   Return appointment to Pain Management Center as scheduled.     Review of Systems     Objective:   Physical Exam        Assessment & Plan:

## 2015-01-23 NOTE — Patient Instructions (Signed)

## 2015-01-23 NOTE — Progress Notes (Signed)
Safety precautions to be maintained throughout the outpatient stay will include: orient to surroundings, keep bed in low position, maintain call bell within reach at all times, provide assistance with transfer out of bed and ambulation.  

## 2015-01-24 ENCOUNTER — Telehealth: Payer: Self-pay | Admitting: *Deleted

## 2015-01-24 NOTE — Telephone Encounter (Signed)
Left voicemail to call our office with any questions, problems or concerns.

## 2015-01-28 ENCOUNTER — Telehealth: Payer: Self-pay | Admitting: Pain Medicine

## 2015-01-28 NOTE — Telephone Encounter (Signed)
Thank you :)

## 2015-01-28 NOTE — Telephone Encounter (Signed)
Patient advised to go to ED if necessary. Taking Norco and Ibuprofen, using heating pad. Next appt Nov 22.

## 2015-01-28 NOTE — Telephone Encounter (Signed)
Attempted to call patient, message left. 

## 2015-01-28 NOTE — Telephone Encounter (Signed)
Kerri Fuentes and Nurses  Please inform patient that she may go to the emergency room for evaluation today if necessary. Please inform me of patient's next appointment in pain clinic and inform me of patient's medications that patient is presently taking. May consider PNCV EMG studies and other studies once we discussed patient's condition

## 2015-01-28 NOTE — Telephone Encounter (Signed)
Pain in upper back and neck since procedure last Wednesday. Also increased pain in lower back and right leg, all the way down to the foot.

## 2015-01-28 NOTE — Telephone Encounter (Signed)
Having increased pain since procedure / please call asap

## 2015-02-05 ENCOUNTER — Ambulatory Visit: Payer: Self-pay | Admitting: Pain Medicine

## 2015-02-12 ENCOUNTER — Ambulatory Visit: Attending: Pain Medicine | Admitting: Pain Medicine

## 2015-02-12 ENCOUNTER — Encounter: Payer: Self-pay | Admitting: Pain Medicine

## 2015-02-12 VITALS — BP 127/68 | HR 88 | Temp 98.4°F | Resp 16 | Ht 68.0 in | Wt 130.0 lb

## 2015-02-12 DIAGNOSIS — Q7649 Other congenital malformations of spine, not associated with scoliosis: Secondary | ICD-10-CM | POA: Insufficient documentation

## 2015-02-12 DIAGNOSIS — M5116 Intervertebral disc disorders with radiculopathy, lumbar region: Secondary | ICD-10-CM | POA: Insufficient documentation

## 2015-02-12 DIAGNOSIS — M533 Sacrococcygeal disorders, not elsewhere classified: Secondary | ICD-10-CM

## 2015-02-12 DIAGNOSIS — M1288 Other specific arthropathies, not elsewhere classified, other specified site: Secondary | ICD-10-CM | POA: Diagnosis not present

## 2015-02-12 DIAGNOSIS — M5416 Radiculopathy, lumbar region: Secondary | ICD-10-CM

## 2015-02-12 DIAGNOSIS — M47816 Spondylosis without myelopathy or radiculopathy, lumbar region: Secondary | ICD-10-CM

## 2015-02-12 DIAGNOSIS — M5136 Other intervertebral disc degeneration, lumbar region: Secondary | ICD-10-CM

## 2015-02-12 DIAGNOSIS — M545 Low back pain: Secondary | ICD-10-CM | POA: Diagnosis present

## 2015-02-12 MED ORDER — BACLOFEN 20 MG PO TABS
ORAL_TABLET | ORAL | Status: DC
Start: 2015-02-12 — End: 2015-03-14

## 2015-02-12 MED ORDER — BACLOFEN 20 MG PO TABS
ORAL_TABLET | ORAL | Status: DC
Start: 2015-02-12 — End: 2015-02-12

## 2015-02-12 MED ORDER — HYDROCODONE-ACETAMINOPHEN 10-325 MG PO TABS: ORAL_TABLET | ORAL | Status: DC

## 2015-02-12 MED ORDER — IBUPROFEN 800 MG PO TABS: ORAL_TABLET | ORAL | Status: DC

## 2015-02-12 NOTE — Progress Notes (Signed)
   Subjective:    Patient ID: Kerri Fuentes, female    DOB: 02/01/1986, 29 y.o.   MRN: [id number]  HPI  The patient is a 29 year old female who returns to pain management Center for further evaluation and treatment of pain involving the lumbar and lower. Region. Patient states that she has had significant improvement of her pain increased and standing and walking for an hour or more as opposed to only a couple of minutes prior to procedure. Patient states that she is able to perform activities of daily living without experiencing severe disabling pain since she underwent interventional treatment in pain management Center. We will continue present medications including baclofen and ibuprofen and hydrocodone acetaminophen and proceed with lumbar facet, medial branch nerve, blocks at time return appointment in attempt to decrease severity of patient's symptoms, minimize progression of symptoms, and avoid need for more involved treatment. The patient agreed to suggested treatment plan. Patient denies trauma change in events of daily living because any change in symptomatology and is anxious to proceed with interventional treatment at time return appointment as discussed since patient has had significant improvement of her ability to perform activities of daily living following interventional treatment       Review of Systems     Objective:   Physical Exam  There was tenderness of the splenius capitis and a separate talus musculature regions of mild degree with mild tenderness of the cervical facet cervical paraspinal musculature region as well as the thoracic facet and thoracic paraspinal musculature region. There was no crepitus of the thoracic region noted. Patient appeared to be with bilaterally equal grip strength and Tinel and Phalen's maneuver were without increase of pain of significant degree. Palpation over the lumbar paraspinal muscles lumbar facet region associated with moderate  tends to palpation extension and palpation of the lumbar facets reproduced moderate discomfort. There was moderate to moderately severe tenderness to palpation over the PSIS and PII S region as well as the gluteal and piriformis musculature regions. There was tenderness to palpation greater trochanteric region iliotibial band region. Straight leg raise was tolerates approximately 30 without increased pain with dorsiflexion noted. No definite sensory deficit dermatomal dystrophy detected. There was negative clonus negative Homans. DTRs appeared to be trace at the knees on the left and difficult to elicit on the right. EHL strength was decreased. There was negative clonus negative Homans. Abdomen nontender with no costovertebral angle tenderness noted.      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 May consider pending follow-up evaluations  F/U neurological evaluation. May consider pending follow-up evaluations  May consider radiofrequency rhizolysis or intraspinal procedures pending response to present treatment and F/U evaluation   Patient to call Pain Management Center should patient have concerns prior to scheduled return appointment.

## 2015-02-12 NOTE — Progress Notes (Signed)
Safety precautions to be maintained throughout the outpatient stay will include: orient to surroundings, keep bed in low position, maintain call bell within reach at all times, provide assistance with transfer out of bed and ambulation.  

## 2015-02-12 NOTE — Patient Instructions (Addendum)
Plan   ccGENERAL RISKS AND COMPLICATIONS  What are the risk, side effects and possible complications? Generally speaking, most procedures are safe.  However, with any procedure there are risks, side effects, and the possibility of complications.  The risks and complications are dependent upon the sites that are lesioned, or the type of nerve block to be performed.  The closer the procedure is to the spine, the more serious the risks are.  Great care is taken when placing the radio frequency needles, block needles or lesioning probes, but sometimes complications can occur. 1. Infection: Any time there is an injection through the skin, there is a risk of infection.  This is why sterile conditions are used for these blocks.  There are four possible types of infection. 1. Localized skin infection. 2. Central Nervous System Infection-This can be in the form of Meningitis, which can be deadly. 3. Epidural Infections-This can be in the form of an epidural abscess, which can cause pressure inside of the spine, causing compression of the spinal cord with subsequent paralysis. This would require an emergency surgery to decompress, and there are no guarantees that the patient would recover from the paralysis. 4. Discitis-This is an infection of the intervertebral discs.  It occurs in about 1% of discography procedures.  It is difficult to treat and it may lead to surgery.        2. Pain: the needles have to go through skin and soft tissues, will cause soreness.       3. Damage to internal structures:  The nerves to be lesioned may be near blood vessels or    other nerves which can be potentially damaged.       4. Bleeding: Bleeding is more common if the patient is taking blood thinners such as  aspirin, Coumadin, Ticiid, Plavix, etc., or if he/she have some genetic predisposition  such as hemophilia. Bleeding into the spinal canal can cause compression of the spinal  cord with subsequent paralysis.  This would  require an emergency surgery to  decompress and there are no guarantees that the patient would recover from the  paralysis.       5. Pneumothorax:  Puncturing of a lung is a possibility, every time a needle is introduced in  the area of the chest or upper back.  Pneumothorax refers to free air around the  collapsed lung(s), inside of the thoracic cavity (chest cavity).  Another two possible  complications related to a similar event would include: Hemothorax and Chylothorax.   These are variations of the Pneumothorax, where instead of air around the collapsed  lung(s), you may have blood or chyle, respectively.       6. Spinal headaches: They may occur with any procedures in the area of the spine.       7. Persistent CSF (Cerebro-Spinal Fluid) leakage: This is a rare problem, but may occur  with prolonged intrathecal or epidural catheters either due to the formation of a fistulous  track or a dural tear.       8. Nerve damage: By working so close to the spinal cord, there is always a possibility of  nerve damage, which could be as serious as a permanent spinal cord injury with  paralysis.       9. Death:  Although rare, severe deadly allergic reactions known as "Anaphylactic  reaction" can occur to any of the medications used.      10. Worsening of the symptoms:  We can always  make thing worse.  What are the chances of something like this happening? Chances of any of this occuring are extremely low.  By statistics, you have more of a chance of getting killed in a motor vehicle accident: while driving to the hospital than any of the above occurring .  Nevertheless, you should be aware that they are possibilities.  In general, it is similar to taking a shower.  Everybody knows that you can slip, hit your head and get killed.  Does that mean that you should not shower again?  Nevertheless always keep in mind that statistics do not mean anything if you happen to be on the wrong side of them.  Even if a procedure  has a 1 (one) in a 1,000,000 (million) chance of going wrong, it you happen to be that one..Also, keep in mind that by statistics, you have more of a chance of having something go wrong when taking medications.  Who should not have this procedure? If you are on a blood thinning medication (e.g. Coumadin, Plavix, see list of "Blood Thinners"), or if you have an active infection going on, you should not have the procedure.  If you are taking any blood thinners, please inform your physician.  How should I prepare for this procedure?  Do not eat or drink anything at least six hours prior to the procedure.  Bring a driver with you .  It cannot be a taxi.  Come accompanied by an adult that can drive you back, and that is strong enough to help you if your legs get weak or numb from the local anesthetic.  Take all of your medicines the morning of the procedure with just enough water to swallow them.  If you have diabetes, make sure that you are scheduled to have your procedure done first thing in the morning, whenever possible.  If you have diabetes, take only half of your insulin dose and notify our nurse that you have done so as soon as you arrive at the clinic.  If you are diabetic, but only take blood sugar pills (oral hypoglycemic), then do not take them on the morning of your procedure.  You may take them after you have had the procedure.  Do not take aspirin or any aspirin-containing medications, at least eleven (11) days prior to the procedure.  They may prolong bleeding.  Wear loose fitting clothing that may be easy to take off and that you would not mind if it got stained with Betadine or blood.  Do not wear any jewelry or perfume  Remove any nail coloring.  It will interfere with some of our monitoring equipment.  NOTE: Remember that this is not meant to be interpreted as a complete list of all possible complications.  Unforeseen problems may occur.  BLOOD THINNERS The following  drugs contain aspirin or other products, which can cause increased bleeding during surgery and should not be taken for 2 weeks prior to and 1 week after surgery.  If you should need take something for relief of minor pain, you may take acetaminophen which is found in Tylenol,m Datril, Anacin-3 and Panadol. It is not blood thinner. The products listed below are.  Do not take any of the products listed below in addition to any listed on your instruction sheet.  A.P.C or A.P.C with Codeine Codeine Phosphate Capsules #3 Ibuprofen Ridaura  ABC compound Congesprin Imuran rimadil  Advil Cope Indocin Robaxisal  Alka-Seltzer Effervescent Pain Reliever and Antacid Coricidin or Coricidin-D  Indomethacin Rufen  Alka-Seltzer plus Cold Medicine Cosprin Ketoprofen S-A-C Tablets  Anacin Analgesic Tablets or Capsules Coumadin Korlgesic Salflex  Anacin Extra Strength Analgesic tablets or capsules CP-2 Tablets Lanoril Salicylate  Anaprox Cuprimine Capsules Levenox Salocol  Anexsia-D Dalteparin Magan Salsalate  Anodynos Darvon compound Magnesium Salicylate Sine-off  Ansaid Dasin Capsules Magsal Sodium Salicylate  Anturane Depen Capsules Marnal Soma  APF Arthritis pain formula Dewitt's Pills Measurin Stanback  Argesic Dia-Gesic Meclofenamic Sulfinpyrazone  Arthritis Bayer Timed Release Aspirin Diclofenac Meclomen Sulindac  Arthritis pain formula Anacin Dicumarol Medipren Supac  Analgesic (Safety coated) Arthralgen Diffunasal Mefanamic Suprofen  Arthritis Strength Bufferin Dihydrocodeine Mepro Compound Suprol  Arthropan liquid Dopirydamole Methcarbomol with Aspirin Synalgos  ASA tablets/Enseals Disalcid Micrainin Tagament  Ascriptin Doan's Midol Talwin  Ascriptin A/D Dolene Mobidin Tanderil  Ascriptin Extra Strength Dolobid Moblgesic Ticlid  Ascriptin with Codeine Doloprin or Doloprin with Codeine Momentum Tolectin  Asperbuf Duoprin Mono-gesic Trendar  Aspergum Duradyne Motrin or Motrin IB Triminicin  Aspirin  plain, buffered or enteric coated Durasal Myochrisine Trigesic  Aspirin Suppositories Easprin Nalfon Trillsate  Aspirin with Codeine Ecotrin Regular or Extra Strength Naprosyn Uracel  Atromid-S Efficin Naproxen Ursinus  Auranofin Capsules Elmiron Neocylate Vanquish  Axotal Emagrin Norgesic Verin  Azathioprine Empirin or Empirin with Codeine Normiflo Vitamin E  Azolid Emprazil Nuprin Voltaren  Bayer Aspirin plain, buffered or children's or timed BC Tablets or powders Encaprin Orgaran Warfarin Sodium  Buff-a-Comp Enoxaparin Orudis Zorpin  Buff-a-Comp with Codeine Equegesic Os-Cal-Gesic   Buffaprin Excedrin plain, buffered or Extra Strength Oxalid   Bufferin Arthritis Strength Feldene Oxphenbutazone   Bufferin plain or Extra Strength Feldene Capsules Oxycodone with Aspirin   Bufferin with Codeine Fenoprofen Fenoprofen Pabalate or Pabalate-SF   Buffets II Flogesic Panagesic   Buffinol plain or Extra Strength Florinal or Florinal with Codeine Panwarfarin   Buf-Tabs Flurbiprofen Penicillamine   Butalbital Compound Four-way cold tablets Penicillin   Butazolidin Fragmin Pepto-Bismol   Carbenicillin Geminisyn Percodan   Carna Arthritis Reliever Geopen Persantine   Carprofen Gold's salt Persistin   Chloramphenicol Goody's Phenylbutazone   Chloromycetin Haltrain Piroxlcam   Clmetidine heparin Plaquenil   Cllnoril Hyco-pap Ponstel   Clofibrate Hydroxy chloroquine Propoxyphen         Before stopping any of these medications, be sure to consult the physician who ordered them.  Some, such as Coumadin (Warfarin) are ordered to prevent or treat serious conditions such as "deep thrombosis", "pumonary embolisms", and other heart problems.  The amount of time that you may need off of the medication may also vary with the medication and the reason for which you were taking it.  If you are taking any of these medications, please make sure you notify your pain physician before you undergo any  procedures.         Facet Blocks Patient Information  Description: The facets are joints in the spine between the vertebrae.  Like any joints in the body, facets can become irritated and painful.  Arthritis can also effect the facets.  By injecting steroids and local anesthetic in and around these joints, we can temporarily block the nerve supply to them.  Steroids act directly on irritated nerves and tissues to reduce selling and inflammation which often leads to decreased pain.  Facet blocks may be done anywhere along the spine from the neck to the low back depending upon the location of your pain.   After numbing the skin with local anesthetic (like Novocaine), a small needle is passed onto the facet joints  under x-ray guidance.  You may experience a sensation of pressure while this is being done.  The entire block usually lasts about 15-25 minutes.   Conditions which may be treated by facet blocks:   Low back/buttock pain  Neck/shoulder pain  Certain types of headaches  Preparation for the injection:  1. Do not eat any solid food or dairy products within 6 hours of your appointment. 2. You may drink clear liquid up to 2 hours before appointment.  Clear liquids include water, black coffee, juice or soda.  No milk or cream please. 3. You may take your regular medication, including pain medications, with a sip of water before your appointment.  Diabetics should hold regular insulin (if taken separately) and take 1/2 normal NPH dose the morning of the procedure.  Carry some sugar containing items with you to your appointment. 4. A driver must accompany you and be prepared to drive you home after your procedure. 5. Bring all your current medications with you. 6. An IV may be inserted and sedation may be given at the discretion of the physician. 7. A blood pressure cuff, EKG and other monitors will often be applied during the procedure.  Some patients may need to have extra oxygen  administered for a short period. 8. You will be asked to provide medical information, including your allergies and medications, prior to the procedure.  We must know immediately if you are taking blood thinners (like Coumadin/Warfarin) or if you are allergic to IV iodine contrast (dye).  We must know if you could possible be pregnant.  Possible side-effects:   Bleeding from needle site  Infection (rare, may require surgery)  Nerve injury (rare)  Numbness & tingling (temporary)  Difficulty urinating (rare, temporary)  Spinal headache (a headache worse with upright posture)  Light-headedness (temporary)  Pain at injection site (serveral days)  Decreased blood pressure (rare, temporary)  Weakness in arm/leg (temporary)  Pressure sensation in back/neck (temporary)   Call if you experience:   Fever/chills associated with headache or increased back/neck pain  Headache worsened by an upright position  New onset, weakness or numbness of an extremity below the injection site  Hives or difficulty breathing (go to the emergency room)  Inflammation or drainage at the injection site(s)  Severe back/neck pain greater than usual  New symptoms which are concerning to you  Please note:  Although the local anesthetic injected can often make your back or neck feel good for several hours after the injection, the pain will likely return. It takes 3-7 days for steroids to work.  You may not notice any pain relief for at least one week.  If effective, we will often do a series of 2-3 injections spaced 3-6 weeks apart to maximally decrease your pain.  After the initial series, you may be a candidate for a more permanent nerve block of the facets.  If you have any questions, please call #[phone] [facility]

## 2015-02-21 ENCOUNTER — Ambulatory Visit: Payer: Self-pay | Admitting: Pain Medicine

## 2015-03-06 ENCOUNTER — Ambulatory Visit: Payer: Self-pay | Admitting: Pain Medicine

## 2015-03-14 ENCOUNTER — Ambulatory Visit: Attending: Pain Medicine | Admitting: Pain Medicine

## 2015-03-14 ENCOUNTER — Encounter: Payer: Self-pay | Admitting: Pain Medicine

## 2015-03-14 VITALS — BP 132/72 | HR 90 | Temp 98.1°F | Resp 16 | Ht 68.0 in | Wt 130.0 lb

## 2015-03-14 DIAGNOSIS — M79605 Pain in left leg: Secondary | ICD-10-CM | POA: Diagnosis present

## 2015-03-14 DIAGNOSIS — Q7649 Other congenital malformations of spine, not associated with scoliosis: Secondary | ICD-10-CM | POA: Insufficient documentation

## 2015-03-14 DIAGNOSIS — M533 Sacrococcygeal disorders, not elsewhere classified: Secondary | ICD-10-CM | POA: Diagnosis not present

## 2015-03-14 DIAGNOSIS — M47816 Spondylosis without myelopathy or radiculopathy, lumbar region: Secondary | ICD-10-CM

## 2015-03-14 DIAGNOSIS — M5116 Intervertebral disc disorders with radiculopathy, lumbar region: Secondary | ICD-10-CM | POA: Insufficient documentation

## 2015-03-14 DIAGNOSIS — S8292XA Unspecified fracture of left lower leg, initial encounter for closed fracture: Secondary | ICD-10-CM | POA: Insufficient documentation

## 2015-03-14 DIAGNOSIS — M5416 Radiculopathy, lumbar region: Secondary | ICD-10-CM

## 2015-03-14 DIAGNOSIS — M5136 Other intervertebral disc degeneration, lumbar region: Secondary | ICD-10-CM

## 2015-03-14 DIAGNOSIS — M545 Low back pain: Secondary | ICD-10-CM | POA: Diagnosis present

## 2015-03-14 DIAGNOSIS — M79604 Pain in right leg: Secondary | ICD-10-CM | POA: Diagnosis present

## 2015-03-14 MED ORDER — BACLOFEN 20 MG PO TABS: ORAL_TABLET | ORAL | Status: DC

## 2015-03-14 MED ORDER — HYDROCODONE-ACETAMINOPHEN 10-325 MG PO TABS: ORAL_TABLET | ORAL | Status: DC

## 2015-03-14 MED ORDER — AMITRIPTYLINE HCL 25 MG PO TABS: ORAL_TABLET | ORAL | Status: DC

## 2015-03-14 NOTE — Progress Notes (Signed)
Safety precautions to be maintained throughout the outpatient stay will include: orient to surroundings, keep bed in low position, maintain call bell within reach at all times, provide assistance with transfer out of bed and ambulation.  

## 2015-03-14 NOTE — Progress Notes (Signed)
   Subjective:    Patient ID: Kerri MoralesBrandy Leigh Fuentes, female    DOB: 09/07/1985, 29 y.o.   MRN: [id number]  HPI  The patient is a 29 year old female who returns to pain management Center for further evaluation and treatment of pain involving the lower back and lower extremity regions. The patient has had improvement of her pain with block of nerves to the sacroiliac joint. The patient states she has had some difficulty sleeping. We discussed patient's condition and will resume Elavil. The patient has history of taking Elavil without undesirable side effects. The patient will continue baclofen and ibuprofen and hydrocodone acetaminophen. We will consider patient for block of nerves to the sacroiliac joint at time of return appointment as discussed and as well. The patient denied any trauma change in events of daily living the cost change in symptomatology. The patient states that his been improvement of her pain since treatment in pain management center which is allowed her to increase activities of daily living. The patient was with understanding and agreed to suggested treatment plan.    Review of Systems     Objective:   Physical Exam  There was tenderness to palpation of paraspinal muscular region cervical region cervical facet region palpation which reproduces mild discomfort. There was tenderness of the acromioclavicular and glenohumeral joint regions of mild degree as well. Patient appeared to be unremarkable Spurling's maneuver. Tinel and Phalen's maneuver were without increased pain of significant degree. Patient appeared to be with bilaterally equal grip strength. Palpation over the cervical facet cervical paraspinal musculature region as well as the thoracic facet thoracic paraspinal musculature region was about significant increased pain. Palpation over the lumbar paraspinal musculature region lumbar facet region was attends to palpation of moderate degree. Lateral bending rotation  extension and palpation of the lumbar facets reproduce moderate discomfort. Palpation over the region of the PSIS and PII S regions reproduce moderate to moderately severe discomfort. There was mild tenderness to palpation of the greater trochanteric region and iliotibial band region. Straight leg raise was tolerates approximately 20 without increased pain with dorsiflexion noted. There appeared to be negative clonus negative Homans. No definite sensory deficit or dermatomal distribution was detected. EHL strength was slightly decreased. Patient appeared to be with pelvic tilt with palpation over the PSIS and PII S regions reproducing moderate the severe discomfort. 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 Caution Elavil can cause respiratory depression excessive sedation confusion and other side effects  Block of nerves to the sacroiliac joint to be performed at time return appointment  F/U PCP Dr. Clint Fuentes for evaliation of  BP and general medical  condition  F/U surgical evaluation. May consider pending follow-up evaluations  F/U neurological evaluation. May consider PNCV/EMG studies and other studies pending follow-up evaluations  May consider radiofrequency rhizolysis or intraspinal procedures pending response to present treatment and F/U evaluation   Patient to call Pain Management Center should patient have concerns prior to scheduled return appointment.

## 2015-03-14 NOTE — Patient Instructions (Addendum)
Plan   Continue present medications baclofen ibuprofen and hydrocodone acetaminophen Begin Elavil. Caution Elavil can cause respiratory depression excessive sedation confusion and other side effects  Block of nerves to the sacroiliac joint to be performed at time return appointment  F/U PCP Dr. Clint Guy for evaliation of  BP and general medical  condition  F/U surgical evaluation. May consider pending follow-up evaluations  F/U neurological evaluation. May consider PNCV/EMG studies and other studies pending follow-up evaluations  May consider radiofrequency rhizolysis or intraspinal procedures pending response to present treatment and F/U evaluation   Patient to call [facility] should patient have concerns prior to scheduled return appointment.GENERAL RISKS AND COMPLICATIONS  What are the risk, side effects and possible complications? Generally speaking, most procedures are safe.  However, with any procedure there are risks, side effects, and the possibility of complications.  The risks and complications are dependent upon the sites that are lesioned, or the type of nerve block to be performed.  The closer the procedure is to the spine, the more serious the risks are.  Great care is taken when placing the radio frequency needles, block needles or lesioning probes, but sometimes complications can occur. 1. Infection: Any time there is an injection through the skin, there is a risk of infection.  This is why sterile conditions are used for these blocks.  There are four possible types of infection. 1. Localized skin infection. 2. Central Nervous System Infection-This can be in the form of Meningitis, which can be deadly. 3. Epidural Infections-This can be in the form of an epidural abscess, which can cause pressure inside of the spine, causing compression of the spinal cord with subsequent paralysis. This would require an emergency surgery to decompress, and there are no guarantees that  the patient would recover from the paralysis. 4. Discitis-This is an infection of the intervertebral discs.  It occurs in about 1% of discography procedures.  It is difficult to treat and it may lead to surgery.        2. Pain: the needles have to go through skin and soft tissues, will cause soreness.       3. Damage to internal structures:  The nerves to be lesioned may be near blood vessels or    other nerves which can be potentially damaged.       4. Bleeding: Bleeding is more common if the patient is taking blood thinners such as  aspirin, Coumadin, Ticiid, Plavix, etc., or if he/she have some genetic predisposition  such as hemophilia. Bleeding into the spinal canal can cause compression of the spinal  cord with subsequent paralysis.  This would require an emergency surgery to  decompress and there are no guarantees that the patient would recover from the  paralysis.       5. Pneumothorax:  Puncturing of a lung is a possibility, every time a needle is introduced in  the area of the chest or upper back.  Pneumothorax refers to free air around the  collapsed lung(s), inside of the thoracic cavity (chest cavity).  Another two possible  complications related to a similar event would include: Hemothorax and Chylothorax.   These are variations of the Pneumothorax, where instead of air around the collapsed  lung(s), you may have blood or chyle, respectively.       6. Spinal headaches: They may occur with any procedures in the area of the spine.       7. Persistent CSF (Cerebro-Spinal Fluid) leakage: This is a  rare problem, but may occur  with prolonged intrathecal or epidural catheters either due to the formation of a fistulous  track or a dural tear.       8. Nerve damage: By working so close to the spinal cord, there is always a possibility of  nerve damage, which could be as serious as a permanent spinal cord injury with  paralysis.       9. Death:  Although rare, severe deadly allergic reactions known  as "Anaphylactic  reaction" can occur to any of the medications used.      10. Worsening of the symptoms:  We can always make thing worse.  What are the chances of something like this happening? Chances of any of this occuring are extremely low.  By statistics, you have more of a chance of getting killed in a motor vehicle accident: while driving to the hospital than any of the above occurring .  Nevertheless, you should be aware that they are possibilities.  In general, it is similar to taking a shower.  Everybody knows that you can slip, hit your head and get killed.  Does that mean that you should not shower again?  Nevertheless always keep in mind that statistics do not mean anything if you happen to be on the wrong side of them.  Even if a procedure has a 1 (one) in a 1,000,000 (million) chance of going wrong, it you happen to be that one..Also, keep in mind that by statistics, you have more of a chance of having something go wrong when taking medications.  Who should not have this procedure? If you are on a blood thinning medication (e.g. Coumadin, Plavix, see list of "Blood Thinners"), or if you have an active infection going on, you should not have the procedure.  If you are taking any blood thinners, please inform your physician.  How should I prepare for this procedure?  Do not eat or drink anything at least six hours prior to the procedure.  Bring a driver with you .  It cannot be a taxi.  Come accompanied by an adult that can drive you back, and that is strong enough to help you if your legs get weak or numb from the local anesthetic.  Take all of your medicines the morning of the procedure with just enough water to swallow them.  If you have diabetes, make sure that you are scheduled to have your procedure done first thing in the morning, whenever possible.  If you have diabetes, take only half of your insulin dose and notify our nurse that you have done so as soon as you arrive at  the clinic.  If you are diabetic, but only take blood sugar pills (oral hypoglycemic), then do not take them on the morning of your procedure.  You may take them after you have had the procedure.  Do not take aspirin or any aspirin-containing medications, at least eleven (11) days prior to the procedure.  They may prolong bleeding.  Wear loose fitting clothing that may be easy to take off and that you would not mind if it got stained with Betadine or blood.  Do not wear any jewelry or perfume  Remove any nail coloring.  It will interfere with some of our monitoring equipment.  NOTE: Remember that this is not meant to be interpreted as a complete list of all possible complications.  Unforeseen problems may occur.  BLOOD THINNERS The following drugs contain aspirin or other products, which  can cause increased bleeding during surgery and should not be taken for 2 weeks prior to and 1 week after surgery.  If you should need take something for relief of minor pain, you may take acetaminophen which is found in Tylenol,m Datril, Anacin-3 and Panadol. It is not blood thinner. The products listed below are.  Do not take any of the products listed below in addition to any listed on your instruction sheet.  A.P.C or A.P.C with Codeine Codeine Phosphate Capsules #3 Ibuprofen Ridaura  ABC compound Congesprin Imuran rimadil  Advil Cope Indocin Robaxisal  Alka-Seltzer Effervescent Pain Reliever and Antacid Coricidin or Coricidin-D  Indomethacin Rufen  Alka-Seltzer plus Cold Medicine Cosprin Ketoprofen S-A-C Tablets  Anacin Analgesic Tablets or Capsules Coumadin Korlgesic Salflex  Anacin Extra Strength Analgesic tablets or capsules CP-2 Tablets Lanoril Salicylate  Anaprox Cuprimine Capsules Levenox Salocol  Anexsia-D Dalteparin Magan Salsalate  Anodynos Darvon compound Magnesium Salicylate Sine-off  Ansaid Dasin Capsules Magsal Sodium Salicylate  Anturane Depen Capsules Marnal Soma  APF Arthritis pain  formula Dewitt's Pills Measurin Stanback  Argesic Dia-Gesic Meclofenamic Sulfinpyrazone  Arthritis Bayer Timed Release Aspirin Diclofenac Meclomen Sulindac  Arthritis pain formula Anacin Dicumarol Medipren Supac  Analgesic (Safety coated) Arthralgen Diffunasal Mefanamic Suprofen  Arthritis Strength Bufferin Dihydrocodeine Mepro Compound Suprol  Arthropan liquid Dopirydamole Methcarbomol with Aspirin Synalgos  ASA tablets/Enseals Disalcid Micrainin Tagament  Ascriptin Doan's Midol Talwin  Ascriptin A/D Dolene Mobidin Tanderil  Ascriptin Extra Strength Dolobid Moblgesic Ticlid  Ascriptin with Codeine Doloprin or Doloprin with Codeine Momentum Tolectin  Asperbuf Duoprin Mono-gesic Trendar  Aspergum Duradyne Motrin or Motrin IB Triminicin  Aspirin plain, buffered or enteric coated Durasal Myochrisine Trigesic  Aspirin Suppositories Easprin Nalfon Trillsate  Aspirin with Codeine Ecotrin Regular or Extra Strength Naprosyn Uracel  Atromid-S Efficin Naproxen Ursinus  Auranofin Capsules Elmiron Neocylate Vanquish  Axotal Emagrin Norgesic Verin  Azathioprine Empirin or Empirin with Codeine Normiflo Vitamin E  Azolid Emprazil Nuprin Voltaren  Bayer Aspirin plain, buffered or children's or timed BC Tablets or powders Encaprin Orgaran Warfarin Sodium  Buff-a-Comp Enoxaparin Orudis Zorpin  Buff-a-Comp with Codeine Equegesic Os-Cal-Gesic   Buffaprin Excedrin plain, buffered or Extra Strength Oxalid   Bufferin Arthritis Strength Feldene Oxphenbutazone   Bufferin plain or Extra Strength Feldene Capsules Oxycodone with Aspirin   Bufferin with Codeine Fenoprofen Fenoprofen Pabalate or Pabalate-SF   Buffets II Flogesic Panagesic   Buffinol plain or Extra Strength Florinal or Florinal with Codeine Panwarfarin   Buf-Tabs Flurbiprofen Penicillamine   Butalbital Compound Four-way cold tablets Penicillin   Butazolidin Fragmin Pepto-Bismol   Carbenicillin Geminisyn Percodan   Carna Arthritis Reliever Geopen  Persantine   Carprofen Gold's salt Persistin   Chloramphenicol Goody's Phenylbutazone   Chloromycetin Haltrain Piroxlcam   Clmetidine heparin Plaquenil   Cllnoril Hyco-pap Ponstel   Clofibrate Hydroxy chloroquine Propoxyphen         Before stopping any of these medications, be sure to consult the physician who ordered them.  Some, such as Coumadin (Warfarin) are ordered to prevent or treat serious conditions such as "deep thrombosis", "pumonary embolisms", and other heart problems.  The amount of time that you may need off of the medication may also vary with the medication and the reason for which you were taking it.  If you are taking any of these medications, please make sure you notify your pain physician before you undergo any procedures.         Sacroiliac (SI) Joint Injection Patient Information  Description: The sacroiliac joint connects  the scrum (very low back and tailbone) to the ilium (a pelvic bone which also forms half of the hip joint).  Normally this joint experiences very little motion.  When this joint becomes inflamed or unstable low back and or hip and pelvis pain may result.  Injection of this joint with local anesthetics (numbing medicines) and steroids can provide diagnostic information and reduce pain.  This injection is performed with the aid of x-ray guidance into the tailbone area while you are lying on your stomach.   You may experience an electrical sensation down the leg while this is being done.  You may also experience numbness.  We also may ask if we are reproducing your normal pain during the injection.  Conditions which may be treated SI injection:   Low back, buttock, hip or leg pain  Preparation for the Injection:  1. Do not eat any solid food or dairy products within 6 hours of your appointment.  2. You may drink clear liquids up to 2 hours before appointment.  Clear liquids include water, black coffee, juice or soda.  No milk or cream  please. 3. You may take your regular medications, including pain medications with a sip of water before your appointment.  Diabetics should hold regular insulin (if take separately) and take 1/2 normal NPH dose the morning of the procedure.  Carry some sugar containing items with you to your appointment. 4. A driver must accompany you and be prepared to drive you home after your procedure. 5. Bring all of your current medications with you. 6. An IV may be inserted and sedation may be given at the discretion of the physician. 7. A blood pressure cuff, EKG and other monitors will often be applied during the procedure.  Some patients may need to have extra oxygen administered for a short period.  8. You will be asked to provide medical information, including your allergies, prior to the procedure.  We must know immediately if you are taking blood thinners (like Coumadin/Warfarin) or if you are allergic to IV iodine contrast (dye).  We must know if you could possible be pregnant.  Possible side effects:   Bleeding from needle site  Infection (rare, may require surgery)  Nerve injury (rare)  Numbness & tingling (temporary)  A brief convulsion or seizure  Light-headedness (temporary)  Pain at injection site (several days)  Decreased blood pressure (temporary)  Weakness in the leg (temporary)   Call if you experience:   New onset weakness or numbness of an extremity below the injection site that last more than 8 hours.  Hives or difficulty breathing ( go to the emergency room)  Inflammation or drainage at the injection site  Any new symptoms which are concerning to you  Please note:  Although the local anesthetic injected can often make your back/ hip/ buttock/ leg feel good for several hours after the injections, the pain will likely return.  It takes 3-7 days for steroids to work in the sacroiliac area.  You may not notice any pain relief for at least that one week.  If  effective, we will often do a series of three injections spaced 3-6 weeks apart to maximally decrease your pain.  After the initial series, we generally will wait some months before a repeat injection of the same type.  If you have any questions, please call [phone] [facility]

## 2015-03-27 ENCOUNTER — Ambulatory Visit: Payer: Self-pay | Admitting: Pain Medicine

## 2015-04-11 ENCOUNTER — Encounter: Payer: Self-pay | Admitting: Pain Medicine

## 2015-04-11 ENCOUNTER — Ambulatory Visit: Attending: Pain Medicine | Admitting: Pain Medicine

## 2015-04-11 VITALS — BP 134/75 | HR 93 | Temp 98.6°F | Resp 16 | Ht 68.0 in | Wt 130.0 lb

## 2015-04-11 DIAGNOSIS — M47816 Spondylosis without myelopathy or radiculopathy, lumbar region: Secondary | ICD-10-CM

## 2015-04-11 DIAGNOSIS — M5136 Other intervertebral disc degeneration, lumbar region: Secondary | ICD-10-CM | POA: Diagnosis not present

## 2015-04-11 DIAGNOSIS — M79606 Pain in leg, unspecified: Secondary | ICD-10-CM | POA: Diagnosis present

## 2015-04-11 DIAGNOSIS — M545 Low back pain: Secondary | ICD-10-CM | POA: Diagnosis present

## 2015-04-11 DIAGNOSIS — M5416 Radiculopathy, lumbar region: Secondary | ICD-10-CM

## 2015-04-11 DIAGNOSIS — M533 Sacrococcygeal disorders, not elsewhere classified: Secondary | ICD-10-CM | POA: Insufficient documentation

## 2015-04-11 DIAGNOSIS — Q7649 Other congenital malformations of spine, not associated with scoliosis: Secondary | ICD-10-CM | POA: Diagnosis not present

## 2015-04-11 MED ORDER — BACLOFEN 20 MG PO TABS: ORAL_TABLET | ORAL | Status: DC

## 2015-04-11 MED ORDER — HYDROCODONE-ACETAMINOPHEN 10-325 MG PO TABS: ORAL_TABLET | ORAL | Status: DC

## 2015-04-11 NOTE — Progress Notes (Signed)
Safety precautions to be maintained throughout the outpatient stay will include: orient to surroundings, keep bed in low position, maintain call bell within reach at all times, provide assistance with transfer out of bed and ambulation.  

## 2015-04-11 NOTE — Patient Instructions (Addendum)
Plan   Continue present medications baclofen ibuprofen and hydrocodone acetaminophen . Do not take Elavil since you were unable to tolerate Elavil and have already stopped Elavil  Lumbar facet, medial branch nerve, blocks to be performed at time return appointment  F/U PCP Dr. Clint Guy for evaliation of  BP and general medical  condition  F/U surgical evaluation. May consider pending follow-up evaluations  F/U neurological evaluation. May consider PNCV/EMG studies and other studies pending follow-up evaluations  May consider radiofrequency rhizolysis or intraspinal procedures pending response to present treatment and F/U evaluation   Patient to call Pain Management Center should patient have concerns prior to scheduled return appointmentFacet Joint Block The facet joints connect the bones of the spine (vertebrae). They make it possible for you to bend, twist, and make other movements with your spine. They also prevent you from overbending, overtwisting, and making other excessive movements.  A facet joint block is a procedure where a numbing medicine (anesthetic) is injected into a facet joint. Often, a type of anti-inflammatory medicine called a steroid is also injected. A facet joint block may be done for two reasons:   Diagnosis. A facet joint block may be done as a test to see whether neck or back pain is caused by a worn-down or infected facet joint. If the pain gets better after a facet joint block, it means the pain is probably coming from the facet joint. If the pain does not get better, it means the pain is probably not coming from the facet joint.   Therapy. A facet joint block may be done to relieve neck or back pain caused by a facet joint. A facet joint block is only done as a therapy if the pain does not improve with medicine, exercise programs, physical therapy, and other forms of pain management. LET [facility] CARE PROVIDER KNOW ABOUT:   Any allergies you have.   All  medicines you are taking, including vitamins, herbs, eyedrops, and over-the-counter medicines and creams.   Previous problems you or members of your family have had with the use of anesthetics.   Any blood disorders you have had.   Other health problems you have. RISKS AND COMPLICATIONS Generally, having a facet joint block is safe. However, as with any procedure, complications can occur. Possible complications associated with having a facet joint block include:   Bleeding.   Injury to a nerve near the injection site.   Pain at the injection site.   Weakness or numbness in areas controlled by nerves near the injection site.   Infection.   Temporary fluid retention.   Allergic reaction to anesthetics or medicines used during the procedure. BEFORE THE PROCEDURE   Follow your health care provider's instructions if you are taking dietary supplements or medicines. You may need to stop taking them or reduce your dosage.   Do not take any new dietary supplements or medicines without asking your health care provider first.   Follow your health care provider's instructions about eating and drinking before the procedure. You may need to stop eating and drinking several hours before the procedure.   Arrange to have an adult drive you home after the procedure. PROCEDURE  You may need to remove your clothing and dress in an open-back gown so that your health care provider can access your spine.   The procedure will be done while you are lying on an X-ray table. Most of the time you will be asked to lie on your stomach, but  you may be asked to lie in a different position if an injection will be made in your neck.   Special machines will be used to monitor your oxygen levels, heart rate, and blood pressure.   If an injection will be made in your neck, an intravenous (IV) tube will be inserted into one of your veins. Fluids and medicine will flow directly into your body through  the IV tube.   The area over the facet joint where the injection will be made will be cleaned with an antiseptic soap. The surrounding skin will be covered with sterile drapes.   An anesthetic will be applied to your skin to make the injection area numb. You may feel a temporary stinging or burning sensation.   A video X-ray machine will be used to locate the joint. A contrast dye may be injected into the facet joint area to help with locating the joint.   When the joint is located, an anesthetic medicine will be injected into the joint through the needle.   Your health care provider will ask you whether you feel pain relief. If you do feel relief, a steroid may be injected to provide pain relief for a longer period of time. If you do not feel relief or feel only partial relief, additional injections of an anesthetic may be made in other facet joints.   The needle will be removed, the skin will be cleansed, and bandages will be applied.  AFTER THE PROCEDURE   You will be observed for 15-30 minutes before being allowed to go home. Do not drive. Have an adult drive you or take a taxi or public transportation instead.   If you feel pain relief, the pain will return in several hours or days when the anesthetic wears off.   You may feel pain relief 2-14 days after the procedure. The amount of time this relief lasts varies from person to person.   It is normal to feel some tenderness over the injected area(s) for 2 days following the procedure.   If you have diabetes, you may have a temporary increase in blood sugar.   This information is not intended to replace advice given to you by your health care provider. Make sure you discuss any questions you have with your health care provider.   Document Released: 07/29/2006 Document Revised: 03/30/2014 Document Reviewed: 12/28/2011 Elsevier Interactive Patient Education 2016 [employer] Pain Management Discharge  Instructions  General Discharge Instructions :  If you need to reach your doctor call: Monday-Friday 8:00 am - 4:00 pm at [phone] or toll free [phone].  After clinic hours [phone] to have operator reach doctor.  Bring all of your medication bottles to all your appointments in the pain clinic.  To cancel or reschedule your appointment with Pain Management please remember to call 24 hours in advance to avoid a fee.  Refer to the educational materials which you have been given on: General Risks, I had my Procedure. Discharge Instructions, Post Sedation.  Post Procedure Instructions:  The drugs you were given will stay in your system until tomorrow, so for the next 24 hours you should not drive, make any legal decisions or drink any alcoholic beverages.  You may eat anything you prefer, but it is better to start with liquids then soups and crackers, and gradually work up to solid foods.  Please notify your doctor immediately if you have any unusual bleeding, trouble breathing or pain that is not related to your normal pain.  Depending on the type of procedure that was done, some parts of your body may feel week and/or numb.  This usually clears up by tonight or the next day.  Walk with the use of an assistive device or accompanied by an adult for the 24 hours.  You may use ice on the affected area for the first 24 hours.  Put ice in a Ziploc bag and cover with a towel and place against area 15 minutes on 15 minutes off.  You may switch to heat after 24 hours.GENERAL RISKS AND COMPLICATIONS  What are the risk, side effects and possible complications? Generally speaking, most procedures are safe.  However, with any procedure there are risks, side effects, and the possibility of complications.  The risks and complications are dependent upon the sites that are lesioned, or the type of nerve block to be performed.  The closer the procedure is to the spine, the more serious the  risks are.  Great care is taken when placing the radio frequency needles, block needles or lesioning probes, but sometimes complications can occur.  Infection: Any time there is an injection through the skin, there is a risk of infection.  This is why sterile conditions are used for these blocks.  There are four possible types of infection.  Localized skin infection.  Central Nervous System Infection-This can be in the form of Meningitis, which can be deadly.  Epidural Infections-This can be in the form of an epidural abscess, which can cause pressure inside of the spine, causing compression of the spinal cord with subsequent paralysis. This would require an emergency surgery to decompress, and there are no guarantees that the patient would recover from the paralysis.  Discitis-This is an infection of the intervertebral discs.  It occurs in about 1% of discography procedures.  It is difficult to treat and it may lead to surgery.        2. Pain: the needles have to go through skin and soft tissues, will cause soreness.       3. Damage to internal structures:  The nerves to be lesioned may be near blood vessels or    other nerves which can be potentially damaged.       4. Bleeding: Bleeding is more common if the patient is taking blood thinners such as  aspirin, Coumadin, Ticiid, Plavix, etc., or if he/she have some genetic predisposition  such as hemophilia. Bleeding into the spinal canal can cause compression of the spinal  cord with subsequent paralysis.  This would require an emergency surgery to  decompress and there are no guarantees that the patient would recover from the  paralysis.       5. Pneumothorax:  Puncturing of a lung is a possibility, every time a needle is introduced in  the area of the chest or upper back.  Pneumothorax refers to free air around the  collapsed lung(s), inside of the thoracic cavity (chest cavity).  Another two possible  complications related to a similar event would  include: Hemothorax and Chylothorax.   These are variations of the Pneumothorax, where instead of air around the collapsed  lung(s), you may have blood or chyle, respectively.       6. Spinal headaches: They may occur with any procedures in the area of the spine.       7. Persistent CSF (Cerebro-Spinal Fluid) leakage: This is a rare problem, but may occur  with prolonged intrathecal or epidural catheters either due to the formation of  a fistulous  track or a dural tear.       8. Nerve damage: By working so close to the spinal cord, there is always a possibility of  nerve damage, which could be as serious as a permanent spinal cord injury with  paralysis.       9. Death:  Although rare, severe deadly allergic reactions known as "Anaphylactic  reaction" can occur to any of the medications used.      10. Worsening of the symptoms:  We can always make thing worse.  What are the chances of something like this happening? Chances of any of this occuring are extremely low.  By statistics, you have more of a chance of getting killed in a motor vehicle accident: while driving to the hospital than any of the above occurring .  Nevertheless, you should be aware that they are possibilities.  In general, it is similar to taking a shower.  Everybody knows that you can slip, hit your head and get killed.  Does that mean that you should not shower again?  Nevertheless always keep in mind that statistics do not mean anything if you happen to be on the wrong side of them.  Even if a procedure has a 1 (one) in a 1,000,000 (million) chance of going wrong, it you happen to be that one..Also, keep in mind that by statistics, you have more of a chance of having something go wrong when taking medications.  Who should not have this procedure? If you are on a blood thinning medication (e.g. Coumadin, Plavix, see list of "Blood Thinners"), or if you have an active infection going on, you should not have the procedure.  If you are  taking any blood thinners, please inform your physician.  How should I prepare for this procedure?  Do not eat or drink anything at least six hours prior to the procedure.  Bring a driver with you .  It cannot be a taxi.  Come accompanied by an adult that can drive you back, and that is strong enough to help you if your legs get weak or numb from the local anesthetic.  Take all of your medicines the morning of the procedure with just enough water to swallow them.  If you have diabetes, make sure that you are scheduled to have your procedure done first thing in the morning, whenever possible.  If you have diabetes, take only half of your insulin dose and notify our nurse that you have done so as soon as you arrive at the clinic.  If you are diabetic, but only take blood sugar pills (oral hypoglycemic), then do not take them on the morning of your procedure.  You may take them after you have had the procedure.  Do not take aspirin or any aspirin-containing medications, at least eleven (11) days prior to the procedure.  They may prolong bleeding.  Wear loose fitting clothing that may be easy to take off and that you would not mind if it got stained with Betadine or blood.  Do not wear any jewelry or perfume  Remove any nail coloring.  It will interfere with some of our monitoring equipment.  NOTE: Remember that this is not meant to be interpreted as a complete list of all possible complications.  Unforeseen problems may occur.  BLOOD THINNERS The following drugs contain aspirin or other products, which can cause increased bleeding during surgery and should not be taken for 2 weeks prior to and 1 week  after surgery.  If you should need take something for relief of minor pain, you may take acetaminophen which is found in Tylenol,m Datril, Anacin-3 and Panadol. It is not blood thinner. The products listed below are.  Do not take any of the products listed below in addition to any listed on  your instruction sheet.  A.P.C or A.P.C with Codeine Codeine Phosphate Capsules #3 Ibuprofen Ridaura  ABC compound Congesprin Imuran rimadil  Advil Cope Indocin Robaxisal  Alka-Seltzer Effervescent Pain Reliever and Antacid Coricidin or Coricidin-D  Indomethacin Rufen  Alka-Seltzer plus Cold Medicine Cosprin Ketoprofen S-A-C Tablets  Anacin Analgesic Tablets or Capsules Coumadin Korlgesic Salflex  Anacin Extra Strength Analgesic tablets or capsules CP-2 Tablets Lanoril Salicylate  Anaprox Cuprimine Capsules Levenox Salocol  Anexsia-D Dalteparin Magan Salsalate  Anodynos Darvon compound Magnesium Salicylate Sine-off  Ansaid Dasin Capsules Magsal Sodium Salicylate  Anturane Depen Capsules Marnal Soma  APF Arthritis pain formula Dewitt's Pills Measurin Stanback  Argesic Dia-Gesic Meclofenamic Sulfinpyrazone  Arthritis Bayer Timed Release Aspirin Diclofenac Meclomen Sulindac  Arthritis pain formula Anacin Dicumarol Medipren Supac  Analgesic (Safety coated) Arthralgen Diffunasal Mefanamic Suprofen  Arthritis Strength Bufferin Dihydrocodeine Mepro Compound Suprol  Arthropan liquid Dopirydamole Methcarbomol with Aspirin Synalgos  ASA tablets/Enseals Disalcid Micrainin Tagament  Ascriptin Doan's Midol Talwin  Ascriptin A/D Dolene Mobidin Tanderil  Ascriptin Extra Strength Dolobid Moblgesic Ticlid  Ascriptin with Codeine Doloprin or Doloprin with Codeine Momentum Tolectin  Asperbuf Duoprin Mono-gesic Trendar  Aspergum Duradyne Motrin or Motrin IB Triminicin  Aspirin plain, buffered or enteric coated Durasal Myochrisine Trigesic  Aspirin Suppositories Easprin Nalfon Trillsate  Aspirin with Codeine Ecotrin Regular or Extra Strength Naprosyn Uracel  Atromid-S Efficin Naproxen Ursinus  Auranofin Capsules Elmiron Neocylate Vanquish  Axotal Emagrin Norgesic Verin  Azathioprine Empirin or Empirin with Codeine Normiflo Vitamin E  Azolid Emprazil Nuprin Voltaren  Bayer Aspirin plain, buffered or  children's or timed BC Tablets or powders Encaprin Orgaran Warfarin Sodium  Buff-a-Comp Enoxaparin Orudis Zorpin  Buff-a-Comp with Codeine Equegesic Os-Cal-Gesic   Buffaprin Excedrin plain, buffered or Extra Strength Oxalid   Bufferin Arthritis Strength Feldene Oxphenbutazone   Bufferin plain or Extra Strength Feldene Capsules Oxycodone with Aspirin   Bufferin with Codeine Fenoprofen Fenoprofen Pabalate or Pabalate-SF   Buffets II Flogesic Panagesic   Buffinol plain or Extra Strength Florinal or Florinal with Codeine Panwarfarin   Buf-Tabs Flurbiprofen Penicillamine   Butalbital Compound Four-way cold tablets Penicillin   Butazolidin Fragmin Pepto-Bismol   Carbenicillin Geminisyn Percodan   Carna Arthritis Reliever Geopen Persantine   Carprofen Gold's salt Persistin   Chloramphenicol Goody's Phenylbutazone   Chloromycetin Haltrain Piroxlcam   Clmetidine heparin Plaquenil   Cllnoril Hyco-pap Ponstel   Clofibrate Hydroxy chloroquine Propoxyphen         Before stopping any of these medications, be sure to consult the physician who ordered them.  Some, such as Coumadin (Warfarin) are ordered to prevent or treat serious conditions such as "deep thrombosis", "pumonary embolisms", and other heart problems.  The amount of time that you may need off of the medication may also vary with the medication and the reason for which you were taking it.  If you are taking any of these medications, please make sure you notify your pain physician before you undergo any procedures.

## 2015-04-11 NOTE — Progress Notes (Signed)
Subjective:    Patient ID: Kerri Fuentes, female    DOB: [birth year], 30 y.o.   MRN: [id number]  HPI The patient is a 30 year old female who returns to pain management Center for further evaluation and treatment of pain involving the lumbar lower extremity region buttocks and with pain which patient states appears to have proved up higher in the lumbar region. We discussed patient's condition and patient has had improvement of pain with significant relief of pain following block of nerves to the sacroiliac joint. At the present time patient's pain appears to be in the upper lumbar region and decision has been made to proceed with lumbar facet, medial branch nerve, blocks at time return appointment as discussed and as explained to patient on today's visit who was understanding and in agreement with suggested treatment plan. Neck to tolerate Elavil and Elavil has been discontinued by the patient. We will avoid prescribing additional Elavil palpation at this time. Proceed with lumbar facet, medial branch nerve, blocks at time return appointment in the patient will continue baclofen and ibuprofen and hydrocodone acetaminophen as prescribed. Patient denies trauma change in events of daily living because change in symptomatology. The patient is in agreement with suggested treatment plan.   Review of Systems     Objective:   Physical Exam  There was tenderness to palpation over the paraspinal musculature in the cervical region cervical facet region palpation which reproduces minimal discomfort to mild discomfort with tenderness to palpation of the cervical facet cervical paraspinal musculature region of minimal to mild degree. There was appeared to be unremarkable Spurling's maneuver with minimal tenderness of the acromioclavicular and glenohumeral joint regions. The patient appeared to be with bilaterally equal grip strength and Tinel and Phalen's maneuver were without increased pain of significant  degree. Tennis to palpation of paraspinal must region of the thoracic facet thoracic paraspinal muscular treat with no crepitus of the thoracic region noted. Palpation over the lumbar paraspinal must reason lumbar facet region was attends to palpation of moderate to moderately severe degree with lateral bending rotation extension and palpation of the lumbar facets reproducing moderate to moderately severe degree left as well as on the right. There was moderate tenderness to moderately severe tenderness of the PSIS and PII S regions. There was mild to minimal tenderness of the greater trochanteric region and iliotibial band region. Straight leg raising was tolerates approximately 30 without increased pain with dorsiflexion noted. Patient appeared to be with pelvic tilt no sensory deficit or dermatomal distribution detected. There was negative clonus negative Homans. Abdomen nontender with no costovertebral tenderness noted.      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 Do not take Elavil since you were unable to tolerate Elavil and have already stopped Elavil  Lumbar facet, medial branch nerve, blocks to be performed at time return appointment  F/U PCP Dr. Clint Guy for evaliation of  BP and general medical  condition  F/U surgical evaluation. May consider pending follow-up evaluations  F/U neurological evaluation. May consider PNCV/EMG studies and other studies pending follow-up evaluations  May consider radiofrequency rhizolysis or intraspinal procedures pending response to present treatment and F/U evaluation   Patient to call Pain Management Center should patient have concerns  prior to scheduled  return appointment  Lumbar radiculopathy

## 2015-04-15 ENCOUNTER — Ambulatory Visit: Attending: Pain Medicine | Admitting: Pain Medicine

## 2015-04-15 ENCOUNTER — Encounter: Payer: Self-pay | Admitting: Pain Medicine

## 2015-04-15 VITALS — BP 92/52 | HR 52 | Temp 99.1°F | Resp 16 | Ht 68.0 in | Wt 130.0 lb

## 2015-04-15 DIAGNOSIS — X58XXXA Exposure to other specified factors, initial encounter: Secondary | ICD-10-CM | POA: Insufficient documentation

## 2015-04-15 DIAGNOSIS — M79606 Pain in leg, unspecified: Secondary | ICD-10-CM | POA: Diagnosis present

## 2015-04-15 DIAGNOSIS — M533 Sacrococcygeal disorders, not elsewhere classified: Secondary | ICD-10-CM | POA: Insufficient documentation

## 2015-04-15 DIAGNOSIS — M5416 Radiculopathy, lumbar region: Secondary | ICD-10-CM

## 2015-04-15 DIAGNOSIS — M5136 Other intervertebral disc degeneration, lumbar region: Secondary | ICD-10-CM | POA: Diagnosis not present

## 2015-04-15 DIAGNOSIS — M47816 Spondylosis without myelopathy or radiculopathy, lumbar region: Secondary | ICD-10-CM

## 2015-04-15 DIAGNOSIS — S32059A Unspecified fracture of fifth lumbar vertebra, initial encounter for closed fracture: Secondary | ICD-10-CM | POA: Diagnosis not present

## 2015-04-15 DIAGNOSIS — M545 Low back pain: Secondary | ICD-10-CM | POA: Diagnosis present

## 2015-04-15 DIAGNOSIS — Q7649 Other congenital malformations of spine, not associated with scoliosis: Secondary | ICD-10-CM | POA: Diagnosis not present

## 2015-04-15 MED ORDER — TRIAMCINOLONE ACETONIDE 40 MG/ML IJ SUSP
40.0000 mg | Freq: Once | INTRAMUSCULAR | Status: AC
  Administered 2015-04-15: 40 mg

## 2015-04-15 MED ORDER — LACTATED RINGERS IV SOLN: 1000.0000 mL | INTRAVENOUS | Status: DC

## 2015-04-15 MED ORDER — BUPIVACAINE HCL (PF) 0.25 % IJ SOLN
INTRAMUSCULAR | Status: AC
  Administered 2015-04-15: 30 mL
  Filled 2015-04-15: qty 30

## 2015-04-15 MED ORDER — MIDAZOLAM HCL 5 MG/5ML IJ SOLN
INTRAMUSCULAR | Status: AC
  Administered 2015-04-15: 5 mg via INTRAVENOUS
  Filled 2015-04-15: qty 5

## 2015-04-15 MED ORDER — TRIAMCINOLONE ACETONIDE 40 MG/ML IJ SUSP
INTRAMUSCULAR | Status: AC
  Administered 2015-04-15: 40 mg
  Filled 2015-04-15: qty 1

## 2015-04-15 MED ORDER — MIDAZOLAM HCL 5 MG/5ML IJ SOLN
5.0000 mg | Freq: Once | INTRAMUSCULAR | Status: AC
Start: 2015-04-15 — End: 2015-04-15
  Administered 2015-04-15: 5 mg via INTRAVENOUS

## 2015-04-15 MED ORDER — ORPHENADRINE CITRATE 30 MG/ML IJ SOLN
60.0000 mg | Freq: Once | INTRAMUSCULAR | Status: AC
  Administered 2015-04-15: 60 mg via INTRAMUSCULAR

## 2015-04-15 MED ORDER — ORPHENADRINE CITRATE 30 MG/ML IJ SOLN
INTRAMUSCULAR | Status: AC
  Administered 2015-04-15: 60 mg via INTRAMUSCULAR
  Filled 2015-04-15: qty 2

## 2015-04-15 MED ORDER — FENTANYL CITRATE (PF) 100 MCG/2ML IJ SOLN
100.0000 ug | Freq: Once | INTRAMUSCULAR | Status: AC
  Administered 2015-04-15: 100 ug via INTRAVENOUS

## 2015-04-15 MED ORDER — FENTANYL CITRATE (PF) 100 MCG/2ML IJ SOLN
INTRAMUSCULAR | Status: AC
  Administered 2015-04-15: 100 ug via INTRAVENOUS
  Filled 2015-04-15: qty 2

## 2015-04-15 MED ORDER — BUPIVACAINE HCL (PF) 0.25 % IJ SOLN
30.0000 mL | Freq: Once | INTRAMUSCULAR | Status: AC
Start: 2015-04-15 — End: 2015-04-15
  Administered 2015-04-15: 30 mL

## 2015-04-15 NOTE — Patient Instructions (Addendum)
Plan   Continue present medications baclofen ibuprofen and hydrocodone acetaminophen . Do not take Elavil since you were unable to tolerate Elavil and have already stopped Elavil  F/U PCP Dr. Clint Guy for evaliation of  BP and general medical  condition  F/U surgical evaluation. May consider pending follow-up evaluations  F/U neurological evaluation. May consider PNCV/EMG studies and other studies pending follow-up evaluations  May consider radiofrequency rhizolysis or intraspinal procedures pending response to present treatment and F/U evaluation   Patient to call [facility] should patient have concerns prior to scheduled return appointmentFacet Blocks Patient Information  Description: The facets are joints in the spine between the vertebrae.  Like any joints in the body, facets can become irritated and painful.  Arthritis can also effect the facets.  By injecting steroids and local anesthetic in and around these joints, we can temporarily block the nerve supply to them.  Steroids act directly on irritated nerves and tissues to reduce selling and inflammation which often leads to decreased pain.  Facet blocks may be done anywhere along the spine from the neck to the low back depending upon the location of your pain.   After numbing the skin with local anesthetic (like Novocaine), a small needle is passed onto the facet joints under x-ray guidance.  You may experience a sensation of pressure while this is being done.  The entire block usually lasts about 15-25 minutes.   Conditions which may be treated by facet blocks:   Low back/buttock pain  Neck/shoulder pain  Certain types of headaches  Preparation for the injection:  1. Do not eat any solid food or dairy products within 6 hours of your appointment. 2. You may drink clear liquid up to 2 hours before appointment.  Clear liquids include water, black coffee, juice or soda.  No milk or cream please. 3. You may take your  regular medication, including pain medications, with a sip of water before your appointment.  Diabetics should hold regular insulin (if taken separately) and take 1/2 normal NPH dose the morning of the procedure.  Carry some sugar containing items with you to your appointment. 4. A driver must accompany you and be prepared to drive you home after your procedure. 5. Bring all your current medications with you. 6. An IV may be inserted and sedation may be given at the discretion of the physician. 7. A blood pressure cuff, EKG and other monitors will often be applied during the procedure.  Some patients may need to have extra oxygen administered for a short period. 8. You will be asked to provide medical information, including your allergies and medications, prior to the procedure.  We must know immediately if you are taking blood thinners (like Coumadin/Warfarin) or if you are allergic to IV iodine contrast (dye).  We must know if you could possible be pregnant.  Possible side-effects:   Bleeding from needle site  Infection (rare, may require surgery)  Nerve injury (rare)  Numbness & tingling (temporary)  Difficulty urinating (rare, temporary)  Spinal headache (a headache worse with upright posture)  Light-headedness (temporary)  Pain at injection site (serveral days)  Decreased blood pressure (rare, temporary)  Weakness in arm/leg (temporary)  Pressure sensation in back/neck (temporary)   Call if you experience:   Fever/chills associated with headache or increased back/neck pain  Headache worsened by an upright position  New onset, weakness or numbness of an extremity below the injection site  Hives or difficulty breathing (go to the emergency room)  Inflammation or drainage at the injection site(s)  Severe back/neck pain greater than usual  New symptoms which are concerning to you  Please note:  Although the local anesthetic injected can often make your back or  neck feel good for several hours after the injection, the pain will likely return. It takes 3-7 days for steroids to work.  You may not notice any pain relief for at least one week.  If effective, we will often do a series of 2-3 injections spaced 3-6 weeks apart to maximally decrease your pain.  After the initial series, you may be a candidate for a more permanent nerve block of the facets.  If you have any questions, please call [phone] [facility] Discharge Instructions  General Discharge Instructions :  If you need to reach your doctor call: Monday-Friday 8:00 am - 4:00 pm at [phone] or toll free [phone].  After clinic hours [phone] to have operator reach doctor.  Bring all of your medication bottles to all your appointments in the pain clinic.  To cancel or reschedule your appointment with Pain Management please remember to call 24 hours in advance to avoid a fee.  Refer to the educational materials which you have been given on: General Risks, I had my Procedure. Discharge Instructions, Post Sedation.  Post Procedure Instructions:  The drugs you were given will stay in your system until tomorrow, so for the next 24 hours you should not drive, make any legal decisions or drink any alcoholic beverages.  You may eat anything you prefer, but it is better to start with liquids then soups and crackers, and gradually work up to solid foods.  Please notify your doctor immediately if you have any unusual bleeding, trouble breathing or pain that is not related to your normal pain.  Depending on the type of procedure that was done, some parts of your body may feel week and/or numb.  This usually clears up by tonight or the next day.  Walk with the use of an assistive device or accompanied by an adult for the 24 hours.  You may use ice on the affected area for the first 24 hours.  Put ice in a Ziploc bag and cover with a  towel and place against area 15 minutes on 15 minutes off.  You may switch to heat after 24 hours.

## 2015-04-15 NOTE — Progress Notes (Signed)
Subjective:    Patient ID: Kerri Fuentes, female    DOB: Jun 10, 1985, 30 y.o.   MRN: [id number]  HPI PROCEDURE PERFORMED: Lumbar facet (medial branch block)   NOTE: The patient is a 30 y.o. female who returns to [facility] for further evaluation and treatment of pain involving the lumbar and lower extremity region. 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 The risks, benefits, and expectations of the procedure have been discussed and explained to the patient who was understanding and in agreement with suggested treatment plan. We will proceed with interventional treatment as discussed and as explained to the patient who was understanding and wished to proceed with procedure as planned.   DESCRIPTION OF PROCEDURE: Lumbar facet (medial branch block) with IV Versed, IV fentanyl conscious sedation, EKG, blood pressure, pulse, and pulse oximetry monitoring. The procedure was performed with the patient in the prone position. Betadine prep of proposed entry site performed.   NEEDLE PLACEMENT AT: Left L 3 lumbar facet (medial branch block). Under fluoroscopic guidance with oblique orientation of 15 degrees, a 22-gauge needle was inserted at the L 3 vertebral body level with needle placed at the targeted area of Burton's Eye or Eye of the Scotty Dog with documentation of needle placement in the superior and lateral border of targeted area of Burton's Eye or Eye of the Scotty Dog with oblique orientation of 15 degrees. Following documentation of needle placement at the L 3 vertebral body level, needle placement was then accomplished at the L 4 vertebral body level.   NEEDLE PLACEMENT AT L4  and L5 VERTEBRAL BODY LEVELS ON THE LEFT SIDE The procedure was performed at the L4 and L5 vertebral body levels exactly as was performed at the L 3 vertebral body level utilizing the same technique and under fluoroscopic guidance.  NEEDLE PLACEMENT AT THE SACRAL ALA with AP view of the lumbosacral spine. With the patient in the prone position, Betadine prep of proposed entry site accomplished, a 22 gauge needle was inserted in the region of the sacral ala (groove formed by the superior articulating process of S1 and the sacral wing). Following documentation of needle placement at the sacral ala,  needle placement was then accomplished at the S1 foramen level.   NEEDLE PLACEMENT AT THE S1 FORAMEN LEVEL under fluoroscopic guidance with AP view of the lumbosacral spine and cephalad orientation of the fluoroscope, a 22-gauge needle was placed at the superior and lateral border of the S1 foramen under fluoroscopic guidance. Following documentation of needle placement at the S1 foramen.   Needle placement was then verified at all levels on lateral view. Following documentation of needle placement at all levels on lateral view and following negative aspiration for heme and CSF, each level was injected with 1 mL of 0.25% bupivacaine with Kenalog.     LUMBAR FACET, MEDIAL BRANCH NERVE, BLOCKS PERFORMED ON THE RIGHT SIDE   The procedure was performed on the right side exactly as was performed on the left side at the same levels and utilizing the same technique under fluoroscopic guidance.  Myoneural block injections of the gluteal musculature region Following Betadine prep of proposed entry site a 22-gauge needle was inserted in the gluteal musculature region  and following negative aspiration 1 cc of 0.25% bupivacaine with Norflex was injected for myoneural block injection of the gluteal musculature region times 2     The patient tolerated the procedure well.  A total of 40 mg of Kenalog was utilized  for the procedure.   PLAN:  1. Medications: The patient will continue presently prescribed medications. Baclofen and hydrocodone acetaminophen 2. May consider modification of treatment regimen at time of return appointment pending response to treatment rendered on today's visit. 3. The patient is to follow-up with primary care physician Dr. Clint Guy for further evaluation of blood pressure and general medical condition status post steroid injection performed on today's visit. 4. Surgical follow-up evaluation has been addressed 5. Neurological follow-up evaluation has been addressed 6. The patient may be candidate for radiofrequency procedures, implantation type procedures, and other treatment pending response to treatment and follow-up evaluation. 7. The patient has been advised to call the Pain Management Center prior to scheduled return appointment should there be significant change in condition or should patient have other concerns regarding condition prior to scheduled return appointment.  The patient is understanding and in agreement with suggested treatment plan.    Review of Systems     Objective:   Physical Exam        Assessment & Plan:

## 2015-04-15 NOTE — Progress Notes (Signed)
Safety precautions to be maintained throughout the outpatient stay will include: orient to surroundings, keep bed in low position, maintain call bell within reach at all times, provide assistance with transfer out of bed and ambulation.  

## 2015-04-16 ENCOUNTER — Telehealth: Payer: Self-pay | Admitting: *Deleted

## 2015-04-16 NOTE — Telephone Encounter (Signed)
Message left

## 2015-04-23 ENCOUNTER — Encounter: Payer: Self-pay | Admitting: *Deleted

## 2015-04-23 ENCOUNTER — Emergency Department
Admission: EM | Admit: 2015-04-23 | Discharge: 2015-04-23 | Disposition: A | Discharge status: Home / Self Care | Attending: Emergency Medicine | Admitting: Emergency Medicine

## 2015-04-23 ENCOUNTER — Emergency Department

## 2015-04-23 ENCOUNTER — Telehealth: Payer: Self-pay

## 2015-04-23 DIAGNOSIS — R51 Headache: Secondary | ICD-10-CM | POA: Diagnosis present

## 2015-04-23 DIAGNOSIS — F17200 Nicotine dependence, unspecified, uncomplicated: Secondary | ICD-10-CM | POA: Diagnosis not present

## 2015-04-23 DIAGNOSIS — Z79899 Other long term (current) drug therapy: Secondary | ICD-10-CM | POA: Diagnosis not present

## 2015-04-23 DIAGNOSIS — Z3202 Encounter for pregnancy test, result negative: Secondary | ICD-10-CM | POA: Diagnosis not present

## 2015-04-23 DIAGNOSIS — R519 Headache, unspecified: Secondary | ICD-10-CM

## 2015-04-23 LAB — BASIC METABOLIC PANEL
ANION GAP: 12 (ref 5–15)
BUN: 15 mg/dL (ref 6–20)
CHLORIDE: 100 mmol/L — AB (ref 101–111)
CO2: 25 mmol/L (ref 22–32)
Calcium: 9.8 mg/dL (ref 8.9–10.3)
Creatinine, Ser: 0.65 mg/dL (ref 0.44–1.00)
GFR calc Af Amer: >60 mL/min (ref >60)
GFR calc non Af Amer: >60 mL/min (ref >60)
Glucose, Bld: 95 mg/dL (ref 65–99)
POTASSIUM: 3.5 mmol/L (ref 3.5–5.1)
SODIUM: 137 mmol/L (ref 135–145)

## 2015-04-23 LAB — CBC WITH DIFFERENTIAL/PLATELET
BASOS ABS: 0 10*3/uL (ref 0–0.1)
Basophils Relative: 0 %
EOS ABS: 0.1 10*3/uL (ref 0–0.7)
Eosinophils Relative: 1 %
HCT: 42.3 % (ref 35.0–47.0)
Hemoglobin: 14.2 g/dL (ref 12.0–16.0)
LYMPHS ABS: 2.6 10*3/uL (ref 1.0–3.6)
LYMPHS PCT: 21 %
MCH: 32.6 pg (ref 26.0–34.0)
MCHC: 33.7 g/dL (ref 32.0–36.0)
MCV: 96.9 fL (ref 80.0–100.0)
Monocytes Absolute: 0.6 10*3/uL (ref 0.2–0.9)
Monocytes Relative: 5 %
NEUTROS PCT: 73 %
Neutro Abs: 9 10*3/uL — ABNORMAL HIGH (ref 1.4–6.5)
Platelets: 276 10*3/uL (ref 150–440)
RBC: 4.36 MIL/uL (ref 3.80–5.20)
RDW: 12.9 % (ref 11.5–14.5)
WBC: 12.3 10*3/uL — AB (ref 3.6–11.0)

## 2015-04-23 LAB — POCT PREGNANCY, URINE: Preg Test, Ur: NEGATIVE

## 2015-04-23 MED ORDER — MAGNESIUM SULFATE 2 GM/50ML IV SOLN
2.0000 g | Freq: Once | INTRAVENOUS | Status: AC
  Administered 2015-04-23: 2 g via INTRAVENOUS
  Filled 2015-04-23 (×2): qty 50

## 2015-04-23 MED ORDER — SODIUM CHLORIDE 0.9 % IV BOLUS (SEPSIS)
1000.0000 mL | Freq: Once | INTRAVENOUS | Status: AC
  Administered 2015-04-23: 1000 mL via INTRAVENOUS

## 2015-04-23 MED ORDER — IOHEXOL 300 MG/ML  SOLN
75.0000 mL | Freq: Once | INTRAMUSCULAR | Status: AC | PRN
  Administered 2015-04-23: 75 mL via INTRAVENOUS
  Filled 2015-04-23: qty 75

## 2015-04-23 MED ORDER — PROCHLORPERAZINE EDISYLATE 5 MG/ML IJ SOLN
10.0000 mg | Freq: Four times a day (QID) | INTRAMUSCULAR | Status: DC | PRN
  Administered 2015-04-23: 10 mg via INTRAVENOUS
  Filled 2015-04-23 (×2): qty 2

## 2015-04-23 NOTE — Telephone Encounter (Signed)
Had lumbar facet block on 04-15-15. Since then, has had pain in head and neck. Dr. Metta Clines please advise.

## 2015-04-23 NOTE — Telephone Encounter (Signed)
Patient instructed as Dr. Metta Clines advised. Dr. Metta Clines instructs to have patient come for eval next Monday or Wednesday

## 2015-04-23 NOTE — Telephone Encounter (Signed)
Nurses Please have patient follow up with her primary care physician for evaluation of general medical condition or have patient go to the emergency room for evaluation today. Also schedule patient for evaluation here in the pain clinic by me

## 2015-04-23 NOTE — ED Notes (Signed)
Pt sent to er for eval of headache.  Pt was seen at urgent care and given toradol im without relief.  Pt has n/v.  Pt alert.  Speech clear.

## 2015-04-23 NOTE — ED Provider Notes (Signed)
[facility] Emergency Department Provider Note    ____________________________________________  Time seen: ~2140  I have reviewed the triage vital signs and the nursing notes.   HISTORY  Chief Complaint Headache   History limited by: Not Limited   HPI Kerri Fuentes is a 30 y.o. female who presents to the emergency department today because of concerns for headache. Patient states that she started having headache 4 days ago. She states it has been severe. It is located primarily in the back with radiation towards the front. She describes it as an intense pressure-like pain. She states she does not have a history of migraines and has not had pain like this in the past. The patient denies any trauma to her head. She does state that she smokes.     Past Medical History  Diagnosis Date  . Bertolotti's syndrome   . Allergy   . Anxiety     Patient Active Problem List   Diagnosis Date Noted  . 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 DDD (degenerative disc disease), lumbar 12/27/2014  . 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 Lumbar radiculopathy 12/27/2014    Past Surgical History  Procedure Laterality Date  . No surgical history      Current Outpatient Rx  Name  Route  Sig  Dispense  Refill  . amitriptyline (ELAVIL) 25 MG tablet      Limit one half to one tab at bedtime if tolerated Patient not taking: Reported on 04/11/2015   30 tablet   0   . baclofen (LIORESAL) 20 MG tablet      Limit one tab by mouth per day or 2-4 times per day if tolerated   120 each   0   . HYDROcodone-acetaminophen (NORCO) 10-325 MG tablet      Limit one tablet by mouth 2-4 times per day if tolerated   120 tablet   0   . ibuprofen (ADVIL,MOTRIN) 800 MG tablet      Limit one half to one tab by mouth per day or twice per day if tolerated   60 tablet   0     Allergies Other and Ultram  Family History   Problem Relation Age of Onset  . Hypertension Mother   . Depression Mother     Social History Social History  Substance Use Topics  . Smoking status: Current Every Day Smoker  . Smokeless tobacco: None  . Alcohol Use: No    Review of Systems  Constitutional: Negative for fever. Cardiovascular: Negative for chest pain. Respiratory: Negative for shortness of breath. Gastrointestinal: Negative for abdominal pain, vomiting and diarrhea. Neurological: Positive for headache   10-point ROS otherwise negative.  ____________________________________________   PHYSICAL EXAM:  VITAL SIGNS: ED Triage Vitals  Enc Vitals Group     BP 04/23/15 1845 141/91 mmHg     Pulse Rate 04/23/15 1845 103     Resp 04/23/15 1845 20     Temp 04/23/15 1845 98 F (36.7 C)     Temp Source 04/23/15 1845 Oral     SpO2 04/23/15 1845 99 %     Weight 04/23/15 1845 139 lb (63.05 kg)     Height 04/23/15 1845  (1.727 m)     Head Cir --      Peak Flow --      Pain Score 04/23/15 1859 8   Constitutional: Alert and oriented. Well appearing and in no distress. Eyes: Conjunctivae are  normal. PERRL. Normal extraocular movements. ENT   Head: Normocephalic and atraumatic.   Nose: No congestion/rhinnorhea.   Mouth/Throat: Mucous membranes are moist.   Neck: No stridor. Hematological/Lymphatic/Immunilogical: No cervical lymphadenopathy. Cardiovascular: Normal rate, regular rhythm.  No murmurs, rubs, or gallops. Respiratory: Normal respiratory effort without tachypnea nor retractions. Breath sounds are clear and equal bilaterally. No wheezes/rales/rhonchi. Gastrointestinal: Soft and nontender. No distention.  Genitourinary: Deferred Musculoskeletal: Normal range of motion in all extremities. No joint effusions.  No lower extremity tenderness nor edema. Neurologic:  Normal speech and language. No gross focal neurologic deficits are appreciated.  Skin:  Skin is warm, dry and intact. No rash  noted. Psychiatric: Mood and affect are normal. Speech and behavior are normal. Patient exhibits appropriate insight and judgment.  ____________________________________________    LABS (pertinent positives/negatives)  Labs Reviewed  CBC WITH DIFFERENTIAL/PLATELET - Abnormal; Notable for the following:    WBC 12.3 (*)    Neutro Abs 9.0 (*)    All other components within normal limits  BASIC METABOLIC PANEL - Abnormal; Notable for the following:    Chloride 100 (*)    All other components within normal limits  POCT PREGNANCY, URINE     ____________________________________________   EKG  None  ____________________________________________    RADIOLOGY  CT head              IMPRESSION: Normal CT head with and without contrast; no CT findings of dural venous sinus thrombosis.   ____________________________________________   PROCEDURES  Procedure(s) performed: None  Critical Care performed: No  ____________________________________________   INITIAL IMPRESSION / ASSESSMENT AND PLAN / ED COURSE  Pertinent labs & imaging results that were available during my care of the patient were reviewed by me and considered in my medical decision making (see chart for details).  Patient presented to the emergency department today because of concerns for severe headache that of glass in the past couple of days. On exam no focal neuro findings patient did appear mildly uncomfortable. Given history of smoking and birth control will get CT head venous to rule out sinus lumbar embolism.  CT head negative. The patient states she feels much improved primarily after the magnesium. Will discharge home.  ____________________________________________   FINAL CLINICAL IMPRESSION(S) / ED DIAGNOSES  Final diagnoses:  Headache, unspecified headache type     Phineas Semen, MD 04/24/15 1601

## 2015-04-23 NOTE — Telephone Encounter (Signed)
Pt had procedure last week since then pt head has been hurting and pain is traveling down to her neck

## 2015-04-23 NOTE — Discharge Instructions (Signed)
Please seek medical attention for any high fevers, chest pain, shortness of breath, change in behavior, persistent vomiting, bloody stool or any other new or concerning symptoms. ° ° °General Headache Without Cause °A headache is pain or discomfort felt around the head or neck area. The specific cause of a headache may not be found. There are many causes and types of headaches. A few common ones are: °· Tension headaches. °· Migraine headaches. °· Cluster headaches. °· Chronic daily headaches. °HOME CARE INSTRUCTIONS  °Watch your condition for any changes. Take these steps to help with your condition: °Managing Pain °· Take over-the-counter and prescription medicines only as told by your health care provider. °· Lie down in a dark, quiet room when you have a headache. °· If directed, apply ice to the head and neck area: °¨ Put ice in a plastic bag. °¨ Place a towel between your skin and the bag. °¨ Leave the ice on for 20 minutes, 2-3 times per day. °· Use a heating pad or hot shower to apply heat to the head and neck area as told by your health care provider. °· Keep lights dim if bright lights bother you or make your headaches worse. °Eating and Drinking °· Eat meals on a regular schedule. °· Limit alcohol use. °· Decrease the amount of caffeine you drink, or stop drinking caffeine. °General Instructions °· Keep all follow-up visits as told by your health care provider. This is important. °· Keep a headache journal to help find out what may trigger your headaches. For example, write down: °¨ What you eat and drink. °¨ How much sleep you get. °¨ Any change to your diet or medicines. °· Try massage or other relaxation techniques. °· Limit stress. °· Sit up straight, and do not tense your muscles. °· Do not use tobacco products, including cigarettes, chewing tobacco, or e-cigarettes. If you need help quitting, ask your health care provider. °· Exercise regularly as told by your health care provider. °· Sleep on a  regular schedule. Get 7-9 hours of sleep, or the amount recommended by your health care provider. °SEEK MEDICAL CARE IF:  °· Your symptoms are not helped by medicine. °· You have a headache that is different from the usual headache. °· You have nausea or you vomit. °· You have a fever. °SEEK IMMEDIATE MEDICAL CARE IF:  °· Your headache becomes severe. °· You have repeated vomiting. °· You have a stiff neck. °· You have a loss of vision. °· You have problems with speech. °· You have pain in the eye or ear. °· You have muscular weakness or loss of muscle control. °· You lose your balance or have trouble walking. °· You feel faint or pass out. °· You have confusion. °  °This information is not intended to replace advice given to you by your health care provider. Make sure you discuss any questions you have with your health care provider. °  °Document Released: 03/09/2005 Document Revised: 11/28/2014 Document Reviewed: 07/02/2014 °[employer]vier Interactive Patient Education ©2016 [employer] ° °

## 2015-04-24 NOTE — Telephone Encounter (Signed)
Patient sched to come Monday 04-29-15 per dr crisp instructions, patient notified

## 2015-04-29 ENCOUNTER — Ambulatory Visit: Attending: Pain Medicine | Admitting: Pain Medicine

## 2015-04-29 ENCOUNTER — Encounter: Payer: Self-pay | Admitting: Pain Medicine

## 2015-04-29 VITALS — BP 130/85 | HR 90 | Temp 98.6°F | Resp 15 | Ht 68.0 in | Wt 130.0 lb

## 2015-04-29 DIAGNOSIS — M533 Sacrococcygeal disorders, not elsewhere classified: Secondary | ICD-10-CM | POA: Insufficient documentation

## 2015-04-29 DIAGNOSIS — Q7649 Other congenital malformations of spine, not associated with scoliosis: Secondary | ICD-10-CM | POA: Diagnosis not present

## 2015-04-29 DIAGNOSIS — X58XXXA Exposure to other specified factors, initial encounter: Secondary | ICD-10-CM | POA: Diagnosis not present

## 2015-04-29 DIAGNOSIS — S32059A Unspecified fracture of fifth lumbar vertebra, initial encounter for closed fracture: Secondary | ICD-10-CM | POA: Diagnosis not present

## 2015-04-29 DIAGNOSIS — M5136 Other intervertebral disc degeneration, lumbar region: Secondary | ICD-10-CM | POA: Diagnosis not present

## 2015-04-29 DIAGNOSIS — M79606 Pain in leg, unspecified: Secondary | ICD-10-CM | POA: Diagnosis present

## 2015-04-29 DIAGNOSIS — M5481 Occipital neuralgia: Secondary | ICD-10-CM | POA: Diagnosis not present

## 2015-04-29 DIAGNOSIS — R51 Headache: Secondary | ICD-10-CM | POA: Insufficient documentation

## 2015-04-29 DIAGNOSIS — G43909 Migraine, unspecified, not intractable, without status migrainosus: Secondary | ICD-10-CM | POA: Diagnosis not present

## 2015-04-29 DIAGNOSIS — M545 Low back pain: Secondary | ICD-10-CM | POA: Diagnosis present

## 2015-04-29 DIAGNOSIS — M47816 Spondylosis without myelopathy or radiculopathy, lumbar region: Secondary | ICD-10-CM

## 2015-04-29 DIAGNOSIS — M5416 Radiculopathy, lumbar region: Secondary | ICD-10-CM

## 2015-04-29 MED ORDER — BACLOFEN 20 MG PO TABS: ORAL_TABLET | ORAL | Status: DC

## 2015-04-29 MED ORDER — OXYCODONE HCL 5 MG PO TABS: ORAL_TABLET | ORAL | Status: DC

## 2015-04-29 NOTE — Progress Notes (Signed)
Safety precautions to be maintained throughout the outpatient stay will include: orient to surroundings, keep bed in low position, maintain call bell within reach at all times, provide assistance with transfer out of bed and ambulation.  

## 2015-04-29 NOTE — Progress Notes (Signed)
Subjective:    Patient ID: Kerri Fuentes, female    DOB: [birth year], 30 y.o.   MRN: [id number]  HPI  The patient is a 30 year old female who returns to pain management Center for further evaluation and treatment of pain involving the lower back lower extremity region with headaches as well as patient has been unable to tolerate hydrocodone acetaminophen and states that the medication may be precipitating headaches. Patient states the headache occurred the back of the neck radiating toward the prone the head. The patient states that the headache limits a migraine. We discussed patient's condition and following evaluation examination of patient the patient was with what appeared to be component of greater occipital neuralgia myofascial pain related headaches. We discussed patient's condition including patient's medications and advised patient to follow up her primary care physician regarding the use of ibuprofen which has significant effect on patients hepatic and renal function as well as GI tract.. The patient will follow-up with primary care physician Dr. Clint Guy in this regard. We will replace the hydrocodone acetaminophen with oxycodone which patient is taking without undesirable side effects previously. We will consider patient for modifications of treatment following follow-up evaluation and will proceed with greater occipital nerve block at time return appointment in attempt to decrease severity of patient's headache, minimize progression of headaches, and avoid need for more involved treatment. She'll agreed to suggested treatment plan. The patient stated that her lower back lower extremity pain has improved some degree and that it was aggravated by standing walking twisting turning maneuvers. The patient stated the pain is associated with significant muscle spasms of the lower back mid back region which continue to the back of the neck precipitating headaches. We will remain available to  consider patient for additional modifications of treatment pending response to present treatment regimen and follow-up evaluation. All agreed to suggested treatment plan      Review of Systems     Objective:   Physical Exam there was tenderness to palpation of the splenius capitis and occipitalis musculature region a moderate to moderately severe degree. No masses of the hip negative noted. There was tenderness of the acromioclavicular and glenohumeral joint region a mild degree and patient was at unremarkable Spurling's maneuver. The patient appeared to be with slightly decreased decreased grip strength and Tinel and Phalen's maneuver were without increase of pain of significant degree. Palpation over the cervical facet cervical paraspinal musculature region and the splenius capitis and occipitalis musculature region reproduced predominant portion of patient's pain. Palpation over the lumbar paraspinal musculature region lumbar facet region was attends to palpation with lateral bending rotation extension and palpation of the lumbar facets reproducing moderate discomfort. There was moderate tenderness over the lower thoracic paraspinal musculature region with no crepitus of the thoracic region noted. Straight leg raise was tolerates approximately 20 without increased pain with dorsiflexion noted. Palpation over the PSIS and PII S region reproduces moderate discomfort with mild tenderness of the greater trochanteric region iliotibial band region. There was negative clonus negative Homans. Abdomen nontender with no costovertebral tenderness noted.          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 NO HYDROCODONE. Caution oxycodone can cause respiratory depression, excessive sedation, confusion, and other side effects Exercise extreme caution when taking oxycodone and other medications Try to decrease the ibuprofen dose that you are  taking as we discussed and follow-up with primary care physician regarding  studies to follow the effects of medications on liver kidney GI tract and other areas  Greater occipital nerve blocks to be performed at time return appointment  F/U PCP Dr. Clint Guy for evaliation of  BP and general medical  condition  F/U surgical evaluation. May consider pending follow-up evaluations  F/U neurological evaluation. May consider PNCV/EMG studies and other studies pending follow-up evaluations  May consider radiofrequency rhizolysis or intraspinal procedures pending response to present treatment and F/U evaluation   Patient to call Pain Management Center should patient have concerns prior to scheduled return appointment

## 2015-04-29 NOTE — Patient Instructions (Addendum)
Plan   Continue present medications baclofen ibuprofen and begin oxycodone which you have taken previously. NO HYDROCODONE. Caution oxycodone can cause respiratory depression, excessive sedation, confusion, and other side effects Exercise extreme caution when taking oxycodone and other medications Try to decrease the ibuprofen dose that you are  taking as we discussed and follow-up with primary care physician regarding  studies to follow the effects of medications on liver kidney GI tract and other areas  Greater occipital nerve blocks to be performed at time return appointment  F/U PCP Dr. Clint Guy for evaliation of  BP and general medical  condition  F/U surgical evaluation. May consider pending follow-up evaluations  F/U neurological evaluation. May consider PNCV/EMG studies and other studies pending follow-up evaluations  May consider radiofrequency rhizolysis or intraspinal procedures pending response to present treatment and F/U evaluation   Patient to call Pain Management Center should patient have concerns prior to scheduled return appointmentPain Management Discharge Instructions  General Discharge Instructions :  If you need to reach your doctor call: Monday-Friday 8:00 am - 4:00 pm at [phone] or toll free [phone].  After clinic hours [phone] to have operator reach doctor.  Bring all of your medication bottles to all your appointments in the pain clinic.  To cancel or reschedule your appointment with Pain Management please remember to call 24 hours in advance to avoid a fee.  Refer to the educational materials which you have been given on: General Risks, I had my Procedure. Discharge Instructions, Post Sedation.  Post Procedure Instructions:  The drugs you were given will stay in your system until tomorrow, so for the next 24 hours you should not drive, make any legal decisions or drink any alcoholic beverages.  You may eat anything you prefer, but it is  better to start with liquids then soups and crackers, and gradually work up to solid foods.  Please notify your doctor immediately if you have any unusual bleeding, trouble breathing or pain that is not related to your normal pain.  Depending on the type of procedure that was done, some parts of your body may feel week and/or numb.  This usually clears up by tonight or the next day.  Walk with the use of an assistive device or accompanied by an adult for the 24 hours.  You may use ice on the affected area for the first 24 hours.  Put ice in a Ziploc bag and cover with a towel and place against area 15 minutes on 15 minutes off.  You may switch to heat after 24 hours.Occipital Nerve Block Patient Information  Description: The occipital nerves originate in the cervical (neck) spinal cord and travel upward through muscle and tissue to supply sensation to the back of the head and top of the scalp.  In addition, the nerves control some of the muscles of the scalp.  Occipital neuralgia is an irritation of these nerves which can cause headaches, numbness of the scalp, and neck discomfort.     The occipital nerve block will interrupt nerve transmission through these nerves and can relieve pain and spasm.  The block consists of insertion of a small needle under the skin in the back of the head to deposit local anesthetic (numbing medicine) and/or steroids around the nerve.  The entire block usually lasts less than 5 minutes.  Conditions which may be treated by occipital blocks:   Muscular pain and spasm of the scalp  Nerve irritation, back of the head  Headaches  Upper neck  pain  Preparation for the injection:  1. Do not eat any solid food or dairy products within 6 hours of your appointment. 2. You may drink clear liquids up to 2 hours before appointment.  Clear liquids include water, black coffee, juice or soda.  No milk or cream please. 3. You may take your regular medication, including pain  medications, with a sip of water before you appointment.  Diabetics should hold regular insulin (if taken separately) and take 1/2 normal NPH dose the morning of the procedure.  Carry some sugar containing items with you to your appointment. 4. A driver must accompany you and be prepared to drive you home after your procedure. 5. Bring all your current medications with you. 6. An IV may be inserted and sedation may be given at the discretion of the physician. 7. A blood pressure cuff, EKG, and other monitors will often be applied during the procedure.  Some patients may need to have extra oxygen administered for a short period. 8. You will be asked to provide medical information, including your allergies and medications, prior to the procedure.  We must know immediately if you are taking blood thinners (like Coumadin/Warfarin) or if you are allergic to IV iodine contrast (dye).  We must know if you could possible be pregnant.  9. Do not wear a high collared shirt or turtleneck.  Tie long hair up in the back if possible.  Possible side-effects:   Bleeding from needle site  Infection (rare, may require surgery)  Nerve injury (rare)  Hair on back of neck can be tinged with iodine scrub (this will wash out)  Light-headedness (temporary)  Pain at injection site (several days)  Decreased blood pressure (rare, temporary)  Seizure (very rare)  Call if you experience:   Hives or difficulty breathing ( go to the emergency room)  Inflammation or drainage at the injection site(s)  Please note:  Although the local anesthetic injected can often make your painful muscles or headache feel good for several hours after the injection, the pain may return.  It takes 3-7 days for steroids to work.  You may not notice any pain relief for at least one week.  If effective, we will often do a series of injections spaced 3-6 weeks apart to maximally decrease your pain.  If you have any questions,  please call [phone] [facility] GENERAL RISKS AND COMPLICATIONS  What are the risk, side effects and possible complications? Generally speaking, most procedures are safe.  However, with any procedure there are risks, side effects, and the possibility of complications.  The risks and complications are dependent upon the sites that are lesioned, or the type of nerve block to be performed.  The closer the procedure is to the spine, the more serious the risks are.  Great care is taken when placing the radio frequency needles, block needles or lesioning probes, but sometimes complications can occur. 1. Infection: Any time there is an injection through the skin, there is a risk of infection.  This is why sterile conditions are used for these blocks.  There are four possible types of infection. 1. Localized skin infection. 2. Central Nervous System Infection-This can be in the form of Meningitis, which can be deadly. 3. Epidural Infections-This can be in the form of an epidural abscess, which can cause pressure inside of the spine, causing compression of the spinal cord with subsequent paralysis. This would require an emergency surgery to decompress, and there  are no guarantees that the patient would recover from the paralysis. 4. Discitis-This is an infection of the intervertebral discs.  It occurs in about 1% of discography procedures.  It is difficult to treat and it may lead to surgery.        2. Pain: the needles have to go through skin and soft tissues, will cause soreness.       3. Damage to internal structures:  The nerves to be lesioned may be near blood vessels or    other nerves which can be potentially damaged.       4. Bleeding: Bleeding is more common if the patient is taking blood thinners such as  aspirin, Coumadin, Ticiid, Plavix, etc., or if he/she have some genetic predisposition  such as hemophilia. Bleeding into the spinal canal can cause  compression of the spinal  cord with subsequent paralysis.  This would require an emergency surgery to  decompress and there are no guarantees that the patient would recover from the  paralysis.       5. Pneumothorax:  Puncturing of a lung is a possibility, every time a needle is introduced in  the area of the chest or upper back.  Pneumothorax refers to free air around the  collapsed lung(s), inside of the thoracic cavity (chest cavity).  Another two possible  complications related to a similar event would include: Hemothorax and Chylothorax.   These are variations of the Pneumothorax, where instead of air around the collapsed  lung(s), you may have blood or chyle, respectively.       6. Spinal headaches: They may occur with any procedures in the area of the spine.       7. Persistent CSF (Cerebro-Spinal Fluid) leakage: This is a rare problem, but may occur  with prolonged intrathecal or epidural catheters either due to the formation of a fistulous  track or a dural tear.       8. Nerve damage: By working so close to the spinal cord, there is always a possibility of  nerve damage, which could be as serious as a permanent spinal cord injury with  paralysis.       9. Death:  Although rare, severe deadly allergic reactions known as "Anaphylactic  reaction" can occur to any of the medications used.      10. Worsening of the symptoms:  We can always make thing worse.  What are the chances of something like this happening? Chances of any of this occuring are extremely low.  By statistics, you have more of a chance of getting killed in a motor vehicle accident: while driving to the hospital than any of the above occurring .  Nevertheless, you should be aware that they are possibilities.  In general, it is similar to taking a shower.  Everybody knows that you can slip, hit your head and get killed.  Does that mean that you should not shower again?  Nevertheless always keep in mind that statistics do not mean  anything if you happen to be on the wrong side of them.  Even if a procedure has a 1 (one) in a 1,000,000 (million) chance of going wrong, it you happen to be that one..Also, keep in mind that by statistics, you have more of a chance of having something go wrong when taking medications.  Who should not have this procedure? If you are on a blood thinning medication (e.g. Coumadin, Plavix, see list of "Blood Thinners"), or if you have an active  infection going on, you should not have the procedure.  If you are taking any blood thinners, please inform your physician.  How should I prepare for this procedure?  Do not eat or drink anything at least six hours prior to the procedure.  Bring a driver with you .  It cannot be a taxi.  Come accompanied by an adult that can drive you back, and that is strong enough to help you if your legs get weak or numb from the local anesthetic.  Take all of your medicines the morning of the procedure with just enough water to swallow them.  If you have diabetes, make sure that you are scheduled to have your procedure done first thing in the morning, whenever possible.  If you have diabetes, take only half of your insulin dose and notify our nurse that you have done so as soon as you arrive at the clinic.  If you are diabetic, but only take blood sugar pills (oral hypoglycemic), then do not take them on the morning of your procedure.  You may take them after you have had the procedure.  Do not take aspirin or any aspirin-containing medications, at least eleven (11) days prior to the procedure.  They may prolong bleeding.  Wear loose fitting clothing that may be easy to take off and that you would not mind if it got stained with Betadine or blood.  Do not wear any jewelry or perfume  Remove any nail coloring.  It will interfere with some of our monitoring equipment.  NOTE: Remember that this is not meant to be interpreted as a complete list of all possible  complications.  Unforeseen problems may occur.  BLOOD THINNERS The following drugs contain aspirin or other products, which can cause increased bleeding during surgery and should not be taken for 2 weeks prior to and 1 week after surgery.  If you should need take something for relief of minor pain, you may take acetaminophen which is found in Tylenol,m Datril, Anacin-3 and Panadol. It is not blood thinner. The products listed below are.  Do not take any of the products listed below in addition to any listed on your instruction sheet.  A.P.C or A.P.C with Codeine Codeine Phosphate Capsules #3 Ibuprofen Ridaura  ABC compound Congesprin Imuran rimadil  Advil Cope Indocin Robaxisal  Alka-Seltzer Effervescent Pain Reliever and Antacid Coricidin or Coricidin-D  Indomethacin Rufen  Alka-Seltzer plus Cold Medicine Cosprin Ketoprofen S-A-C Tablets  Anacin Analgesic Tablets or Capsules Coumadin Korlgesic Salflex  Anacin Extra Strength Analgesic tablets or capsules CP-2 Tablets Lanoril Salicylate  Anaprox Cuprimine Capsules Levenox Salocol  Anexsia-D Dalteparin Magan Salsalate  Anodynos Darvon compound Magnesium Salicylate Sine-off  Ansaid Dasin Capsules Magsal Sodium Salicylate  Anturane Depen Capsules Marnal Soma  APF Arthritis pain formula Dewitt's Pills Measurin Stanback  Argesic Dia-Gesic Meclofenamic Sulfinpyrazone  Arthritis Bayer Timed Release Aspirin Diclofenac Meclomen Sulindac  Arthritis pain formula Anacin Dicumarol Medipren Supac  Analgesic (Safety coated) Arthralgen Diffunasal Mefanamic Suprofen  Arthritis Strength Bufferin Dihydrocodeine Mepro Compound Suprol  Arthropan liquid Dopirydamole Methcarbomol with Aspirin Synalgos  ASA tablets/Enseals Disalcid Micrainin Tagament  Ascriptin Doan's Midol Talwin  Ascriptin A/D Dolene Mobidin Tanderil  Ascriptin Extra Strength Dolobid Moblgesic Ticlid  Ascriptin with Codeine Doloprin or Doloprin with Codeine Momentum Tolectin  Asperbuf Duoprin  Mono-gesic Trendar  Aspergum Duradyne Motrin or Motrin IB Triminicin  Aspirin plain, buffered or enteric coated Durasal Myochrisine Trigesic  Aspirin Suppositories Easprin Nalfon Trillsate  Aspirin with Codeine Ecotrin Regular or  Extra Strength Naprosyn Uracel  Atromid-S Efficin Naproxen Ursinus  Auranofin Capsules Elmiron Neocylate Vanquish  Axotal Emagrin Norgesic Verin  Azathioprine Empirin or Empirin with Codeine Normiflo Vitamin E  Azolid Emprazil Nuprin Voltaren  Bayer Aspirin plain, buffered or children's or timed BC Tablets or powders Encaprin Orgaran Warfarin Sodium  Buff-a-Comp Enoxaparin Orudis Zorpin  Buff-a-Comp with Codeine Equegesic Os-Cal-Gesic   Buffaprin Excedrin plain, buffered or Extra Strength Oxalid   Bufferin Arthritis Strength Feldene Oxphenbutazone   Bufferin plain or Extra Strength Feldene Capsules Oxycodone with Aspirin   Bufferin with Codeine Fenoprofen Fenoprofen Pabalate or Pabalate-SF   Buffets II Flogesic Panagesic   Buffinol plain or Extra Strength Florinal or Florinal with Codeine Panwarfarin   Buf-Tabs Flurbiprofen Penicillamine   Butalbital Compound Four-way cold tablets Penicillin   Butazolidin Fragmin Pepto-Bismol   Carbenicillin Geminisyn Percodan   Carna Arthritis Reliever Geopen Persantine   Carprofen Gold's salt Persistin   Chloramphenicol Goody's Phenylbutazone   Chloromycetin Haltrain Piroxlcam   Clmetidine heparin Plaquenil   Cllnoril Hyco-pap Ponstel   Clofibrate Hydroxy chloroquine Propoxyphen         Before stopping any of these medications, be sure to consult the physician who ordered them.  Some, such as Coumadin (Warfarin) are ordered to prevent or treat serious conditions such as "deep thrombosis", "pumonary embolisms", and other heart problems.  The amount of time that you may need off of the medication may also vary with the medication and the reason for which you were taking it.  If you are taking any of these medications, please  make sure you notify your pain physician before you undergo any procedures.

## 2015-05-06 ENCOUNTER — Telehealth: Payer: Self-pay | Admitting: Pain Medicine

## 2015-05-06 NOTE — Telephone Encounter (Signed)
Nurses Recommend patient minimize use of oxycodone until return appointment Please discussed with me for further details if necessary

## 2015-05-06 NOTE — Telephone Encounter (Signed)
Patient states that the roxicodone is making her sluggish and she is having some trouble with her memory. Pateint also states that she has been unable to Half the  dose as prescribed so she has been taking the whole  tab. States the "percocets" seem to work better for her and wants to know if she can switch to those instead. Please advise. Thank you.----Jennifer

## 2015-05-06 NOTE — Telephone Encounter (Signed)
Not feeling well on the meds she is taking from Dr. Metta Clines, per msg left by patient on 05-04-15 at 11:34am, please call

## 2015-05-08 ENCOUNTER — Telehealth: Payer: Self-pay | Admitting: Pain Medicine

## 2015-05-08 ENCOUNTER — Ambulatory Visit: Attending: Pain Medicine | Admitting: Pain Medicine

## 2015-05-08 ENCOUNTER — Ambulatory Visit: Payer: Self-pay | Admitting: Pain Medicine

## 2015-05-08 ENCOUNTER — Encounter: Payer: Self-pay | Admitting: Pain Medicine

## 2015-05-08 VITALS — BP 110/78 | HR 65 | Temp 98.6°F | Resp 15 | Ht 68.0 in | Wt 135.0 lb

## 2015-05-08 DIAGNOSIS — M5136 Other intervertebral disc degeneration, lumbar region: Secondary | ICD-10-CM

## 2015-05-08 DIAGNOSIS — M542 Cervicalgia: Secondary | ICD-10-CM | POA: Insufficient documentation

## 2015-05-08 DIAGNOSIS — R51 Headache: Secondary | ICD-10-CM | POA: Diagnosis present

## 2015-05-08 DIAGNOSIS — M5416 Radiculopathy, lumbar region: Secondary | ICD-10-CM

## 2015-05-08 DIAGNOSIS — M533 Sacrococcygeal disorders, not elsewhere classified: Secondary | ICD-10-CM

## 2015-05-08 DIAGNOSIS — M51369 Other intervertebral disc degeneration, lumbar region without mention of lumbar back pain or lower extremity pain: Secondary | ICD-10-CM

## 2015-05-08 DIAGNOSIS — M47816 Spondylosis without myelopathy or radiculopathy, lumbar region: Secondary | ICD-10-CM

## 2015-05-08 DIAGNOSIS — M5481 Occipital neuralgia: Secondary | ICD-10-CM

## 2015-05-08 MED ORDER — ORPHENADRINE CITRATE 30 MG/ML IJ SOLN
INTRAMUSCULAR | Status: AC
  Administered 2015-05-08: 09:00:00
  Filled 2015-05-08: qty 2

## 2015-05-08 MED ORDER — FENTANYL CITRATE (PF) 100 MCG/2ML IJ SOLN: 100.0000 ug | Freq: Once | INTRAMUSCULAR | Status: DC

## 2015-05-08 MED ORDER — TRIAMCINOLONE ACETONIDE 40 MG/ML IJ SUSP
INTRAMUSCULAR | Status: AC
  Administered 2015-05-08: 09:00:00
  Filled 2015-05-08: qty 1

## 2015-05-08 MED ORDER — LACTATED RINGERS IV SOLN: 1000.0000 mL | INTRAVENOUS | Status: DC

## 2015-05-08 MED ORDER — TRIAMCINOLONE ACETONIDE 40 MG/ML IJ SUSP: 40.0000 mg | Freq: Once | INTRAMUSCULAR | Status: DC

## 2015-05-08 MED ORDER — BUPIVACAINE HCL (PF) 0.25 % IJ SOLN
INTRAMUSCULAR | Status: AC
  Administered 2015-05-08: 09:00:00
  Filled 2015-05-08: qty 30

## 2015-05-08 MED ORDER — ORPHENADRINE CITRATE 30 MG/ML IJ SOLN: 60.0000 mg | Freq: Once | INTRAMUSCULAR | Status: DC

## 2015-05-08 MED ORDER — MIDAZOLAM HCL 5 MG/5ML IJ SOLN
INTRAMUSCULAR | Status: AC
  Administered 2015-05-08: 2 mg via INTRAVENOUS
  Filled 2015-05-08: qty 5

## 2015-05-08 MED ORDER — MIDAZOLAM HCL 5 MG/5ML IJ SOLN: 5.0000 mg | Freq: Once | INTRAMUSCULAR | Status: DC

## 2015-05-08 MED ORDER — BUPIVACAINE HCL (PF) 0.25 % IJ SOLN: 30.0000 mL | Freq: Once | INTRAMUSCULAR | Status: DC

## 2015-05-08 MED ORDER — FENTANYL CITRATE (PF) 100 MCG/2ML IJ SOLN
INTRAMUSCULAR | Status: AC
  Administered 2015-05-08: 50 ug via INTRAVENOUS
  Filled 2015-05-08: qty 2

## 2015-05-08 NOTE — Progress Notes (Signed)
   Subjective:    Patient ID: Kerri Fuentes, female    DOB: 03-01-1986, 30 y.o.   MRN: [id number]  HPI  NOTE: The patient is a 30 y.o.-year-old female who [facility] further evaluation and treatment of pain consisting of pain involving the region of the neck and headache.  Patient is with severe pain occurring in the cervical region radiating to the back of the head producing headaches. The patient is felt to be with significant component of pain due to greater occipital neuralgia. . The patient also is with component of lower back lower extremity pain and severe muscle spasms   The risks, benefits, and expectations of the procedure have been discussed and explained to patient, who is understanding and wishes to proceed with interventional treatment as discussed and as explained to patient.  Will proceed with greater occipital nerve blocks with myoneural block injections at this time as discussed and as explained to patient.  All are understanding and in agreement with suggested treatment plan.    PROCEDURE:  Greater occipital nerve block on the left side with IV Versed, IV Fentanyl, conscious sedation, EKG, blood pressure, pulse, pulse oximetry monitoring.  Procedure performed with patient in prone position.  Greater occipital nerve block on the left side.   With patient in prone position, Betadine prep of proposed entry site accomplished.  Following identification of the nuchal ridge, 22 -gauge needle was inserted at the level of the nuchal ridge medial to the occipital artery.  Following negative aspiration, 4cc 0.25% bupivacaine with Kenalog injected for left greater occipital nerve block.  Needle was removed.  Patient tolerated injection well.   Greater occipital nerve block on the rightt side. The greater occipital nerve block on the right side was performed exactly as the left greater occipital nerve block was performed and utilizing the same  technique.   Myoneural block injections of the gluteal musculature region Following Betadine prep of proposed entry site a 22-gauge needle was inserted in the gluteal musculature region and following negative aspiration 2 cc of 0.25% bupivacaine with Norflex was injected for myoneural block injection of the gluteal musculature region times two.  The patient tolerated procedure well   A total of 10 mg Kenalog was utilized for the entire procedure.  PLAN:    1. Medications: Will continue presently prescribed medications baclofen and ibuprofen and oxycodone as discussed at this time. 2. Patient to follow up with primary care physician Dr. Clint Guy for evaluation of blood pressure and general medical condition status post procedure performed on today's visit. 3. Neurological evaluation for further assessment of headaches for further studies as discussed. 4. Surgical evaluation as discussed.  5. Patient may be candidate for Botox injections, radiofrequency procedures, as well as implantation type procedures pending response to treatment rendered on today's visit and pending follow-up evaluation. 6. Patient has been advised to adhere to proper body mechanics and to avoid activities which appear to aggravate condition.cations:  Will continue presently prescribed medications at this time. 7. The patient is understanding and in agreement with the suggested treatment plan.   Review of Systems     Objective:   Physical Exam        Assessment & Plan:

## 2015-05-08 NOTE — Telephone Encounter (Signed)
Doesn't think she can wait until next Wed to see Dr. Metta Clines about meds wants to come sooner

## 2015-05-08 NOTE — Progress Notes (Signed)
Safety precautions to be maintained throughout the outpatient stay will include: orient to surroundings, keep bed in low position, maintain call bell within reach at all times, provide assistance with transfer out of bed and ambulation.  

## 2015-05-08 NOTE — Patient Instructions (Addendum)
Plan   Continue present medications baclofen ibuprofen and  oxycodone which you have taken previously. NO HYDROCODONE.  F/U PCP Dr. Clint Guy for evaliation of  BP and general medical  condition  F/U surgical evaluation. May consider pending follow-up evaluations  F/U neurological evaluation. May consider PNCV/EMG studies and other studies pending follow-up evaluations  May consider radiofrequency rhizolysis or intraspinal procedures pending response to present treatment and F/U evaluation   Patient to call [facility] should patient have concerns prior to scheduled return appointmentGENERAL RISKS AND COMPLICATIONS  What are the risk, side effects and possible complications? Generally speaking, most procedures are safe.  However, with any procedure there are risks, side effects, and the possibility of complications.  The risks and complications are dependent upon the sites that are lesioned, or the type of nerve block to be performed.  The closer the procedure is to the spine, the more serious the risks are.  Great care is taken when placing the radio frequency needles, block needles or lesioning probes, but sometimes complications can occur. 1. Infection: Any time there is an injection through the skin, there is a risk of infection.  This is why sterile conditions are used for these blocks.  There are four possible types of infection. 1. Localized skin infection. 2. Central Nervous System Infection-This can be in the form of Meningitis, which can be deadly. 3. Epidural Infections-This can be in the form of an epidural abscess, which can cause pressure inside of the spine, causing compression of the spinal cord with subsequent paralysis. This would require an emergency surgery to decompress, and there are no guarantees that the patient would recover from the paralysis. 4. Discitis-This is an infection of the intervertebral discs.  It occurs in about 1% of discography procedures.  It is  difficult to treat and it may lead to surgery.        2. Pain: the needles have to go through skin and soft tissues, will cause soreness.       3. Damage to internal structures:  The nerves to be lesioned may be near blood vessels or    other nerves which can be potentially damaged.       4. Bleeding: Bleeding is more common if the patient is taking blood thinners such as  aspirin, Coumadin, Ticiid, Plavix, etc., or if he/she have some genetic predisposition  such as hemophilia. Bleeding into the spinal canal can cause compression of the spinal  cord with subsequent paralysis.  This would require an emergency surgery to  decompress and there are no guarantees that the patient would recover from the  paralysis.       5. Pneumothorax:  Puncturing of a lung is a possibility, every time a needle is introduced in  the area of the chest or upper back.  Pneumothorax refers to free air around the  collapsed lung(s), inside of the thoracic cavity (chest cavity).  Another two possible  complications related to a similar event would include: Hemothorax and Chylothorax.   These are variations of the Pneumothorax, where instead of air around the collapsed  lung(s), you may have blood or chyle, respectively.       6. Spinal headaches: They may occur with any procedures in the area of the spine.       7. Persistent CSF (Cerebro-Spinal Fluid) leakage: This is a rare problem, but may occur  with prolonged intrathecal or epidural catheters either due to the formation of a fistulous  track or  a dural tear.       8. Nerve damage: By working so close to the spinal cord, there is always a possibility of  nerve damage, which could be as serious as a permanent spinal cord injury with  paralysis.       9. Death:  Although rare, severe deadly allergic reactions known as "Anaphylactic  reaction" can occur to any of the medications used.      10. Worsening of the symptoms:  We can always make thing worse.  What are the chances of  something like this happening? Chances of any of this occuring are extremely low.  By statistics, you have more of a chance of getting killed in a motor vehicle accident: while driving to the hospital than any of the above occurring .  Nevertheless, you should be aware that they are possibilities.  In general, it is similar to taking a shower.  Everybody knows that you can slip, hit your head and get killed.  Does that mean that you should not shower again?  Nevertheless always keep in mind that statistics do not mean anything if you happen to be on the wrong side of them.  Even if a procedure has a 1 (one) in a 1,000,000 (million) chance of going wrong, it you happen to be that one..Also, keep in mind that by statistics, you have more of a chance of having something go wrong when taking medications.  Who should not have this procedure? If you are on a blood thinning medication (e.g. Coumadin, Plavix, see list of "Blood Thinners"), or if you have an active infection going on, you should not have the procedure.  If you are taking any blood thinners, please inform your physician.  How should I prepare for this procedure?  Do not eat or drink anything at least six hours prior to the procedure.  Bring a driver with you .  It cannot be a taxi.  Come accompanied by an adult that can drive you back, and that is strong enough to help you if your legs get weak or numb from the local anesthetic.  Take all of your medicines the morning of the procedure with just enough water to swallow them.  If you have diabetes, make sure that you are scheduled to have your procedure done first thing in the morning, whenever possible.  If you have diabetes, take only half of your insulin dose and notify our nurse that you have done so as soon as you arrive at the clinic.  If you are diabetic, but only take blood sugar pills (oral hypoglycemic), then do not take them on the morning of your procedure.  You may take them  after you have had the procedure.  Do not take aspirin or any aspirin-containing medications, at least eleven (11) days prior to the procedure.  They may prolong bleeding.  Wear loose fitting clothing that may be easy to take off and that you would not mind if it got stained with Betadine or blood.  Do not wear any jewelry or perfume  Remove any nail coloring.  It will interfere with some of our monitoring equipment.  NOTE: Remember that this is not meant to be interpreted as a complete list of all possible complications.  Unforeseen problems may occur.  BLOOD THINNERS The following drugs contain aspirin or other products, which can cause increased bleeding during surgery and should not be taken for 2 weeks prior to and 1 week after surgery.  If  you should need take something for relief of minor pain, you may take acetaminophen which is found in Tylenol,m Datril, Anacin-3 and Panadol. It is not blood thinner. The products listed below are.  Do not take any of the products listed below in addition to any listed on your instruction sheet.  A.P.C or A.P.C with Codeine Codeine Phosphate Capsules #3 Ibuprofen Ridaura  ABC compound Congesprin Imuran rimadil  Advil Cope Indocin Robaxisal  Alka-Seltzer Effervescent Pain Reliever and Antacid Coricidin or Coricidin-D  Indomethacin Rufen  Alka-Seltzer plus Cold Medicine Cosprin Ketoprofen S-A-C Tablets  Anacin Analgesic Tablets or Capsules Coumadin Korlgesic Salflex  Anacin Extra Strength Analgesic tablets or capsules CP-2 Tablets Lanoril Salicylate  Anaprox Cuprimine Capsules Levenox Salocol  Anexsia-D Dalteparin Magan Salsalate  Anodynos Darvon compound Magnesium Salicylate Sine-off  Ansaid Dasin Capsules Magsal Sodium Salicylate  Anturane Depen Capsules Marnal Soma  APF Arthritis pain formula Dewitt's Pills Measurin Stanback  Argesic Dia-Gesic Meclofenamic Sulfinpyrazone  Arthritis Bayer Timed Release Aspirin Diclofenac Meclomen Sulindac   Arthritis pain formula Anacin Dicumarol Medipren Supac  Analgesic (Safety coated) Arthralgen Diffunasal Mefanamic Suprofen  Arthritis Strength Bufferin Dihydrocodeine Mepro Compound Suprol  Arthropan liquid Dopirydamole Methcarbomol with Aspirin Synalgos  ASA tablets/Enseals Disalcid Micrainin Tagament  Ascriptin Doan's Midol Talwin  Ascriptin A/D Dolene Mobidin Tanderil  Ascriptin Extra Strength Dolobid Moblgesic Ticlid  Ascriptin with Codeine Doloprin or Doloprin with Codeine Momentum Tolectin  Asperbuf Duoprin Mono-gesic Trendar  Aspergum Duradyne Motrin or Motrin IB Triminicin  Aspirin plain, buffered or enteric coated Durasal Myochrisine Trigesic  Aspirin Suppositories Easprin Nalfon Trillsate  Aspirin with Codeine Ecotrin Regular or Extra Strength Naprosyn Uracel  Atromid-S Efficin Naproxen Ursinus  Auranofin Capsules Elmiron Neocylate Vanquish  Axotal Emagrin Norgesic Verin  Azathioprine Empirin or Empirin with Codeine Normiflo Vitamin E  Azolid Emprazil Nuprin Voltaren  Bayer Aspirin plain, buffered or children's or timed BC Tablets or powders Encaprin Orgaran Warfarin Sodium  Buff-a-Comp Enoxaparin Orudis Zorpin  Buff-a-Comp with Codeine Equegesic Os-Cal-Gesic   Buffaprin Excedrin plain, buffered or Extra Strength Oxalid   Bufferin Arthritis Strength Feldene Oxphenbutazone   Bufferin plain or Extra Strength Feldene Capsules Oxycodone with Aspirin   Bufferin with Codeine Fenoprofen Fenoprofen Pabalate or Pabalate-SF   Buffets II Flogesic Panagesic   Buffinol plain or Extra Strength Florinal or Florinal with Codeine Panwarfarin   Buf-Tabs Flurbiprofen Penicillamine   Butalbital Compound Four-way cold tablets Penicillin   Butazolidin Fragmin Pepto-Bismol   Carbenicillin Geminisyn Percodan   Carna Arthritis Reliever Geopen Persantine   Carprofen Gold's salt Persistin   Chloramphenicol Goody's Phenylbutazone   Chloromycetin Haltrain Piroxlcam   Clmetidine heparin Plaquenil    Cllnoril Hyco-pap Ponstel   Clofibrate Hydroxy chloroquine Propoxyphen         Before stopping any of these medications, be sure to consult the physician who ordered them.  Some, such as Coumadin (Warfarin) are ordered to prevent or treat serious conditions such as "deep thrombosis", "pumonary embolisms", and other heart problems.  The amount of time that you may need off of the medication may also vary with the medication and the reason for which you were taking it.  If you are taking any of these medications, please make sure you notify your pain physician before you undergo any procedures.

## 2015-05-08 NOTE — Telephone Encounter (Signed)
Nurses, We discussed patient's medications with patient again today and please schedule patient for an early evaluation for next week. The patient should go to emergency room for evaluation if she is having extreme difficulty with her medications at this time and she should ask the emergency room physician to call me to discuss patient's condition while she is in the emergency room

## 2015-05-09 ENCOUNTER — Telehealth: Payer: Self-pay | Admitting: *Deleted

## 2015-05-09 ENCOUNTER — Telehealth: Payer: Self-pay

## 2015-05-09 ENCOUNTER — Encounter: Payer: Self-pay | Admitting: Pain Medicine

## 2015-05-09 NOTE — Telephone Encounter (Signed)
Left voicemail to call our office if there are questions or concerns re; procedure on yesterday. 

## 2015-05-09 NOTE — Telephone Encounter (Signed)
Thank you :)

## 2015-05-09 NOTE — Telephone Encounter (Signed)
Had procedure yesterday, unable to keep anything down, throwing up, medicine not working because it wont stay in her stomach long enough.

## 2015-05-09 NOTE — Telephone Encounter (Signed)
pateint awaree and states that diarrhea has stopped. She states she will see Dr. Clint Guy or seek medical attention if symptoms persist.

## 2015-05-09 NOTE — Telephone Encounter (Signed)
Nurses  Have patient see Dr. Clint Guy today I'll go to the emergency room for evaluation of symptoms  Patient may have the emergency room physician or have Dr. Clint Guy call me to discuss patient's condition if necessary

## 2015-05-09 NOTE — Telephone Encounter (Signed)
Spoke with DR Metta Clines, told him of patients request to have her medication change.  Dr Metta Clines will have patient come in on Monday for earlier evaluation.

## 2015-05-10 NOTE — Telephone Encounter (Signed)
Per Dr. Metta Clines, scheduled patient to come in on Monday. She wanted to let Dr. Metta Clines know that she did go to see Dr. Clint Guy and he thinks she should try oxycontin again. Will discuss Monday at appt.

## 2015-05-13 ENCOUNTER — Encounter: Payer: Self-pay | Admitting: Pain Medicine

## 2015-05-13 ENCOUNTER — Ambulatory Visit: Attending: Pain Medicine | Admitting: Pain Medicine

## 2015-05-13 VITALS — BP 122/80 | HR 91 | Temp 98.1°F | Resp 16 | Ht 68.0 in | Wt 130.0 lb

## 2015-05-13 DIAGNOSIS — M5136 Other intervertebral disc degeneration, lumbar region: Secondary | ICD-10-CM | POA: Insufficient documentation

## 2015-05-13 DIAGNOSIS — M5481 Occipital neuralgia: Secondary | ICD-10-CM

## 2015-05-13 DIAGNOSIS — Q7649 Other congenital malformations of spine, not associated with scoliosis: Secondary | ICD-10-CM | POA: Diagnosis not present

## 2015-05-13 DIAGNOSIS — M542 Cervicalgia: Secondary | ICD-10-CM | POA: Diagnosis present

## 2015-05-13 DIAGNOSIS — R51 Headache: Secondary | ICD-10-CM | POA: Diagnosis present

## 2015-05-13 DIAGNOSIS — M533 Sacrococcygeal disorders, not elsewhere classified: Secondary | ICD-10-CM

## 2015-05-13 DIAGNOSIS — M47816 Spondylosis without myelopathy or radiculopathy, lumbar region: Secondary | ICD-10-CM

## 2015-05-13 DIAGNOSIS — M5416 Radiculopathy, lumbar region: Secondary | ICD-10-CM

## 2015-05-13 DIAGNOSIS — G43909 Migraine, unspecified, not intractable, without status migrainosus: Secondary | ICD-10-CM | POA: Insufficient documentation

## 2015-05-13 DIAGNOSIS — M545 Low back pain: Secondary | ICD-10-CM | POA: Diagnosis present

## 2015-05-13 DIAGNOSIS — M51369 Other intervertebral disc degeneration, lumbar region without mention of lumbar back pain or lower extremity pain: Secondary | ICD-10-CM

## 2015-05-13 MED ORDER — HYDROCODONE-ACETAMINOPHEN 10-325 MG PO TABS: ORAL_TABLET | ORAL | Status: DC

## 2015-05-13 NOTE — Patient Instructions (Addendum)
Plan   Continue present medications baclofen ibuprofen and resume hydrocodone acetaminophen       NO OXYCODONE. You have already stopped oxycodone due to intolerable side effects Do not take any oxycodone if you take hydrocodone acetaminophen   F/U PCP Dr. Clint Guy for evaliation of  BP and general medical  condition  F/U surgical evaluation. May consider pending follow-up evaluations  Ask the nurses and secretaries if you have been approved for radiofrequency lumbar facet medial branch nerves  F/U neurological evaluation. May consider PNCV/EMG studies and other studies pending follow-up evaluations  May consider radiofrequency rhizolysis or intraspinal procedures pending response to present treatment and F/U evaluation   Patient to call Pain Management Center should patient have concerns prior to scheduled return appointmentRadiofrequency Lesioning, Care After Refer to this sheet in the next few weeks. These instructions provide you with information about caring for yourself after your procedure. Your health care provider may also give you more specific instructions. Your treatment has been planned according to current medical practices, but problems sometimes occur. Call your health care provider if you have any problems or questions after your procedure. WHAT TO EXPECT AFTER THE PROCEDURE After your procedure, it is common to have:  Pain from the burned nerve.  Temporary numbness. HOME CARE INSTRUCTIONS  Take over-the-counter and prescription medicines only as told by your health care provider.  Return to your normal activities as told by your health care provider. Ask your health care provider what activities are safe for you.  Pay close attention to how you feel after the procedure. If you start to have pain, write down when it hurts and how it feels. This will help you and your health care provider to know if you need an additional treatment.  Check your needle insertion site  every day for signs of infection. Watch for:  Redness, swelling, or pain.  Fluid, blood, or pus.  Keep all follow-up visits as told by your health care provider. This is important. SEEK MEDICAL CARE IF:  Your pain does not get better.  You have redness, swelling, or pain at the needle insertion site.  You have fluid, blood, or pus coming from the needle insertion site.  You have a fever. SEEK IMMEDIATE MEDICAL CARE IF:  You develop sudden, severe pain.  You develop numbness or tingling near the procedure site that does not go away.   This information is not intended to replace advice given to you by your health care provider. Make sure you discuss any questions you have with your health care provider.   Document Released: 11/05/2010 Document Revised: 11/28/2014 Document Reviewed: 04/16/2014 Elsevier Interactive Patient Education 2016 [employer] Radiofrequency Lesioning Radiofrequency lesioning is a procedure that is performed to relieve pain. The procedure is often used for back, neck, or arm pain. Radiofrequency lesioning involves the use of a machine that creates radio waves to make heat. During the procedure, the heat is applied to the nerve that carries the pain signal. The heat damages the nerve and interferes with the pain signal. Pain relief usually lasts for 6 months to 1 year. LET [facility] CARE PROVIDER KNOW ABOUT:  Any allergies you have.  All medicines you are taking, including vitamins, herbs, eye drops, creams, and over-the-counter medicines.  Previous problems you or members of your family have had with the use of anesthetics.  Any blood disorders you have.  Previous surgeries you have had.  Any medical conditions you have.  Whether you are pregnant or  may be pregnant. RISKS AND COMPLICATIONS Generally, this is a safe procedure. However, problems may occur, including:  Pain or soreness at the injection site.  Infection at the injection  site.  Damage to nerves or blood vessels. BEFORE THE PROCEDURE  Ask your health care provider about:  Changing or stopping your regular medicines. This is especially important if you are taking diabetes medicines or blood thinners.  Taking medicines such as aspirin and ibuprofen. These medicines can thin your blood. Do not take these medicines before your procedure if your health care provider instructs you not to.  Follow instructions from your health care provider about eating or drinking restrictions.  Plan to have someone take you home after the procedure.  If you go home right after the procedure, plan to have someone with you for 24 hours. PROCEDURE  You will be given one or more of the following:  A medicine to help you relax (sedative).  A medicine to numb the area (local anesthetic).  You will be awake during the procedure. You will need to be able to talk with the health care provider during the procedure.  With the help of a type of X-ray (fluoroscopy), the health care provider will insert a radiofrequency needle into the area to be treated.  Next, a wire that carries the radio waves (electrode) will be put through the radiofrequency needle. An electrical pulse will be sent through the electrode to verify the correct nerve. You will feel a tingling sensation, and you may have muscle twitching.  Then, the tissue that is around the needle tip will be heated by an electric current that is passed using the radiofrequency machine. This will numb the nerves.  A bandage (dressing) will be put on the insertion area after the procedure is done. The procedure may vary among health care providers and hospitals. AFTER THE PROCEDURE  Your blood pressure, heart rate, breathing rate, and blood oxygen level will be monitored often until the medicines you were given have worn off.  Return to your normal activities as directed by your health care provider.   This information is not  intended to replace advice given to you by your health care provider. Make sure you discuss any questions you have with your health care provider.   Document Released: 11/05/2010 Document Revised: 11/28/2014 Document Reviewed: 04/16/2014 Elsevier Interactive Patient Education 2016 [employer] Pain Management Discharge Instructions  General Discharge Instructions :  If you need to reach your doctor call: Monday-Friday 8:00 am - 4:00 pm at [phone] or toll free [phone].  After clinic hours [phone] to have operator reach doctor.  Bring all of your medication bottles to all your appointments in the pain clinic.  To cancel or reschedule your appointment with Pain Management please remember to call 24 hours in advance to avoid a fee.  Refer to the educational materials which you have been given on: General Risks, I had my Procedure. Discharge Instructions, Post Sedation.  Post Procedure Instructions:  The drugs you were given will stay in your system until tomorrow, so for the next 24 hours you should not drive, make any legal decisions or drink any alcoholic beverages.  You may eat anything you prefer, but it is better to start with liquids then soups and crackers, and gradually work up to solid foods.  Please notify your doctor immediately if you have any unusual bleeding, trouble breathing or pain that is not related to your normal pain.  Depending on the type of  procedure that was done, some parts of your body may feel week and/or numb.  This usually clears up by tonight or the next day.  Walk with the use of an assistive device or accompanied by an adult for the 24 hours.  You may use ice on the affected area for the first 24 hours.  Put ice in a Ziploc bag and cover with a towel and place against area 15 minutes on 15 minutes off.  You may switch to heat after 24 hours.GENERAL RISKS AND COMPLICATIONS  What are the risk, side effects and possible  complications? Generally speaking, most procedures are safe.  However, with any procedure there are risks, side effects, and the possibility of complications.  The risks and complications are dependent upon the sites that are lesioned, or the type of nerve block to be performed.  The closer the procedure is to the spine, the more serious the risks are.  Great care is taken when placing the radio frequency needles, block needles or lesioning probes, but sometimes complications can occur.  Infection: Any time there is an injection through the skin, there is a risk of infection.  This is why sterile conditions are used for these blocks.  There are four possible types of infection.  Localized skin infection.  Central Nervous System Infection-This can be in the form of Meningitis, which can be deadly.  Epidural Infections-This can be in the form of an epidural abscess, which can cause pressure inside of the spine, causing compression of the spinal cord with subsequent paralysis. This would require an emergency surgery to decompress, and there are no guarantees that the patient would recover from the paralysis.  Discitis-This is an infection of the intervertebral discs.  It occurs in about 1% of discography procedures.  It is difficult to treat and it may lead to surgery.        2. Pain: the needles have to go through skin and soft tissues, will cause soreness.       3. Damage to internal structures:  The nerves to be lesioned may be near blood vessels or    other nerves which can be potentially damaged.       4. Bleeding: Bleeding is more common if the patient is taking blood thinners such as  aspirin, Coumadin, Ticiid, Plavix, etc., or if he/she have some genetic predisposition  such as hemophilia. Bleeding into the spinal canal can cause compression of the spinal  cord with subsequent paralysis.  This would require an emergency surgery to  decompress and there are no guarantees that the patient would  recover from the  paralysis.       5. Pneumothorax:  Puncturing of a lung is a possibility, every time a needle is introduced in  the area of the chest or upper back.  Pneumothorax refers to free air around the  collapsed lung(s), inside of the thoracic cavity (chest cavity).  Another two possible  complications related to a similar event would include: Hemothorax and Chylothorax.   These are variations of the Pneumothorax, where instead of air around the collapsed  lung(s), you may have blood or chyle, respectively.       6. Spinal headaches: They may occur with any procedures in the area of the spine.       7. Persistent CSF (Cerebro-Spinal Fluid) leakage: This is a rare problem, but may occur  with prolonged intrathecal or epidural catheters either due to the formation of a fistulous  track or  a dural tear.       8. Nerve damage: By working so close to the spinal cord, there is always a possibility of  nerve damage, which could be as serious as a permanent spinal cord injury with  paralysis.       9. Death:  Although rare, severe deadly allergic reactions known as "Anaphylactic  reaction" can occur to any of the medications used.      10. Worsening of the symptoms:  We can always make thing worse.  What are the chances of something like this happening? Chances of any of this occuring are extremely low.  By statistics, you have more of a chance of getting killed in a motor vehicle accident: while driving to the hospital than any of the above occurring .  Nevertheless, you should be aware that they are possibilities.  In general, it is similar to taking a shower.  Everybody knows that you can slip, hit your head and get killed.  Does that mean that you should not shower again?  Nevertheless always keep in mind that statistics do not mean anything if you happen to be on the wrong side of them.  Even if a procedure has a 1 (one) in a 1,000,000 (million) chance of going wrong, it you happen to be that  one..Also, keep in mind that by statistics, you have more of a chance of having something go wrong when taking medications.  Who should not have this procedure? If you are on a blood thinning medication (e.g. Coumadin, Plavix, see list of "Blood Thinners"), or if you have an active infection going on, you should not have the procedure.  If you are taking any blood thinners, please inform your physician.  How should I prepare for this procedure?  Do not eat or drink anything at least six hours prior to the procedure.  Bring a driver with you .  It cannot be a taxi.  Come accompanied by an adult that can drive you back, and that is strong enough to help you if your legs get weak or numb from the local anesthetic.  Take all of your medicines the morning of the procedure with just enough water to swallow them.  If you have diabetes, make sure that you are scheduled to have your procedure done first thing in the morning, whenever possible.  If you have diabetes, take only half of your insulin dose and notify our nurse that you have done so as soon as you arrive at the clinic.  If you are diabetic, but only take blood sugar pills (oral hypoglycemic), then do not take them on the morning of your procedure.  You may take them after you have had the procedure.  Do not take aspirin or any aspirin-containing medications, at least eleven (11) days prior to the procedure.  They may prolong bleeding.  Wear loose fitting clothing that may be easy to take off and that you would not mind if it got stained with Betadine or blood.  Do not wear any jewelry or perfume  Remove any nail coloring.  It will interfere with some of our monitoring equipment.  NOTE: Remember that this is not meant to be interpreted as a complete list of all possible complications.  Unforeseen problems may occur.  BLOOD THINNERS The following drugs contain aspirin or other products, which can cause increased bleeding during  surgery and should not be taken for 2 weeks prior to and 1 week after surgery.  If  you should need take something for relief of minor pain, you may take acetaminophen which is found in Tylenol,m Datril, Anacin-3 and Panadol. It is not blood thinner. The products listed below are.  Do not take any of the products listed below in addition to any listed on your instruction sheet.  A.P.C or A.P.C with Codeine Codeine Phosphate Capsules #3 Ibuprofen Ridaura  ABC compound Congesprin Imuran rimadil  Advil Cope Indocin Robaxisal  Alka-Seltzer Effervescent Pain Reliever and Antacid Coricidin or Coricidin-D  Indomethacin Rufen  Alka-Seltzer plus Cold Medicine Cosprin Ketoprofen S-A-C Tablets  Anacin Analgesic Tablets or Capsules Coumadin Korlgesic Salflex  Anacin Extra Strength Analgesic tablets or capsules CP-2 Tablets Lanoril Salicylate  Anaprox Cuprimine Capsules Levenox Salocol  Anexsia-D Dalteparin Magan Salsalate  Anodynos Darvon compound Magnesium Salicylate Sine-off  Ansaid Dasin Capsules Magsal Sodium Salicylate  Anturane Depen Capsules Marnal Soma  APF Arthritis pain formula Dewitt's Pills Measurin Stanback  Argesic Dia-Gesic Meclofenamic Sulfinpyrazone  Arthritis Bayer Timed Release Aspirin Diclofenac Meclomen Sulindac  Arthritis pain formula Anacin Dicumarol Medipren Supac  Analgesic (Safety coated) Arthralgen Diffunasal Mefanamic Suprofen  Arthritis Strength Bufferin Dihydrocodeine Mepro Compound Suprol  Arthropan liquid Dopirydamole Methcarbomol with Aspirin Synalgos  ASA tablets/Enseals Disalcid Micrainin Tagament  Ascriptin Doan's Midol Talwin  Ascriptin A/D Dolene Mobidin Tanderil  Ascriptin Extra Strength Dolobid Moblgesic Ticlid  Ascriptin with Codeine Doloprin or Doloprin with Codeine Momentum Tolectin  Asperbuf Duoprin Mono-gesic Trendar  Aspergum Duradyne Motrin or Motrin IB Triminicin  Aspirin plain, buffered or enteric coated Durasal Myochrisine Trigesic  Aspirin  Suppositories Easprin Nalfon Trillsate  Aspirin with Codeine Ecotrin Regular or Extra Strength Naprosyn Uracel  Atromid-S Efficin Naproxen Ursinus  Auranofin Capsules Elmiron Neocylate Vanquish  Axotal Emagrin Norgesic Verin  Azathioprine Empirin or Empirin with Codeine Normiflo Vitamin E  Azolid Emprazil Nuprin Voltaren  Bayer Aspirin plain, buffered or children's or timed BC Tablets or powders Encaprin Orgaran Warfarin Sodium  Buff-a-Comp Enoxaparin Orudis Zorpin  Buff-a-Comp with Codeine Equegesic Os-Cal-Gesic   Buffaprin Excedrin plain, buffered or Extra Strength Oxalid   Bufferin Arthritis Strength Feldene Oxphenbutazone   Bufferin plain or Extra Strength Feldene Capsules Oxycodone with Aspirin   Bufferin with Codeine Fenoprofen Fenoprofen Pabalate or Pabalate-SF   Buffets II Flogesic Panagesic   Buffinol plain or Extra Strength Florinal or Florinal with Codeine Panwarfarin   Buf-Tabs Flurbiprofen Penicillamine   Butalbital Compound Four-way cold tablets Penicillin   Butazolidin Fragmin Pepto-Bismol   Carbenicillin Geminisyn Percodan   Carna Arthritis Reliever Geopen Persantine   Carprofen Gold's salt Persistin   Chloramphenicol Goody's Phenylbutazone   Chloromycetin Haltrain Piroxlcam   Clmetidine heparin Plaquenil   Cllnoril Hyco-pap Ponstel   Clofibrate Hydroxy chloroquine Propoxyphen         Before stopping any of these medications, be sure to consult the physician who ordered them.  Some, such as Coumadin (Warfarin) are ordered to prevent or treat serious conditions such as "deep thrombosis", "pumonary embolisms", and other heart problems.  The amount of time that you may need off of the medication may also vary with the medication and the reason for which you were taking it.  If you are taking any of these medications, please make sure you notify your pain physician before you undergo any procedures.

## 2015-05-13 NOTE — Progress Notes (Signed)
   Subjective:    Patient ID: Kerri Fuentes, female    DOB: [birth year], 30 y.o.   MRN: [id number]  HPI  The patient is a 30 year old female who returns to pain management for further evaluation and treatment of pain involving neck headaches lower back lower extremity region. The patient states she had tremendous relief of headache following greater occipital nerve blocks. We discussed patient's condition and present time patient has been unable to tolerate oxycodone. The patient has undergone reevaluation for of her primary care physician Dr. Clint Guy for evaluation of general medical condition. We will discontinue the oxycodone and we will resume patient's hydrocodone acetaminophen 10/325 limited to 24 per day with a quantity of 120 PRESCRIBED for patient. We will observe patient's response to the medication change and will consider patient for additional modifications pending follow-up evaluation. The patient was unable to tolerate Neurontin. The patient was understanding and agreed to suggested treatment plan     Review of Systems     Objective:   Physical Exam there was tenderness of the paraspinal muscular region cervical region cervical facet region with tenderness over the splenius capitis and occipitalis musculature region a mild degree. There was mild tenderness of the acromial clavicular and glenohumeral joint region and patient was with bilaterally equal grip strength with Tinel and Phalen's maneuver reproducing minimal discomfort. No masses of the head and neck were noted. There were no bounding pulsations of the temporal region noted. Palpation over the thoracic region thoracic facet region was attends to palpation of mild degree with no crepitus of the thoracic region noted. There was evidence of muscle spasms involving the upper and lower thoracic paraspinal musculature region with no crepitus of the thoracic region noted. Palpation over the lumbar paraspinal must which region  lumbar facet region was without increased pain with lateral bending rotation extension and palpation over the lumbar facets reproducing moderate discomfort. There was moderate tenderness of the PSIS and PII S region as well as the gluteal and piriformis musculature regions. Minimal tenderness of the greater trochanteric region iliotibial band region. Straight leg raising was tolerated to 30 without an increase of pain with dorsiflexion noted. No definite sensory deficit or dermatomal distribution detected.. Negative clonus negative Homans. Slightly decreased. 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 You have already stopped oxycodone due to intolerable side effects Do not take any oxycodone if you take hydrocodone acetaminophen   F/U PCP Dr. Clint Guy for evaliation of  BP and general medical  condition  F/U surgical evaluation. May consider pending follow-up evaluations  Ask the nurses and secretaries if you have been approved for radiofrequency lumbar facet medial branch nerves  F/U neurological evaluation. May consider PNCV/EMG studies and other studies pending follow-up evaluations  May consider radiofrequency rhizolysis or intraspinal procedures pending response to present treatment and F/U evaluation   Patient to call Pain Management Center should patient have concerns prior to scheduled return appointment

## 2015-05-13 NOTE — Progress Notes (Signed)
Safety precautions to be maintained throughout the outpatient stay will include: orient to surroundings, keep bed in low position, maintain call bell within reach at all times, provide assistance with transfer out of bed and ambulation.  

## 2015-05-15 ENCOUNTER — Encounter: Payer: Self-pay | Admitting: Pain Medicine

## 2015-05-22 ENCOUNTER — Other Ambulatory Visit: Payer: Self-pay | Admitting: Pain Medicine

## 2015-06-10 ENCOUNTER — Ambulatory Visit: Attending: Pain Medicine | Admitting: Pain Medicine

## 2015-06-10 ENCOUNTER — Encounter: Payer: Self-pay | Admitting: Pain Medicine

## 2015-06-10 VITALS — BP 128/87 | HR 105 | Temp 98.3°F | Resp 16 | Ht 68.0 in | Wt 130.0 lb

## 2015-06-10 DIAGNOSIS — M545 Low back pain: Secondary | ICD-10-CM | POA: Diagnosis present

## 2015-06-10 DIAGNOSIS — M5481 Occipital neuralgia: Secondary | ICD-10-CM

## 2015-06-10 DIAGNOSIS — Q7649 Other congenital malformations of spine, not associated with scoliosis: Secondary | ICD-10-CM | POA: Insufficient documentation

## 2015-06-10 DIAGNOSIS — G43909 Migraine, unspecified, not intractable, without status migrainosus: Secondary | ICD-10-CM | POA: Insufficient documentation

## 2015-06-10 DIAGNOSIS — M533 Sacrococcygeal disorders, not elsewhere classified: Secondary | ICD-10-CM

## 2015-06-10 DIAGNOSIS — M47816 Spondylosis without myelopathy or radiculopathy, lumbar region: Secondary | ICD-10-CM

## 2015-06-10 DIAGNOSIS — M79604 Pain in right leg: Secondary | ICD-10-CM | POA: Diagnosis present

## 2015-06-10 DIAGNOSIS — M79605 Pain in left leg: Secondary | ICD-10-CM | POA: Diagnosis present

## 2015-06-10 DIAGNOSIS — M51369 Other intervertebral disc degeneration, lumbar region without mention of lumbar back pain or lower extremity pain: Secondary | ICD-10-CM

## 2015-06-10 DIAGNOSIS — M5136 Other intervertebral disc degeneration, lumbar region: Secondary | ICD-10-CM

## 2015-06-10 DIAGNOSIS — M5416 Radiculopathy, lumbar region: Secondary | ICD-10-CM

## 2015-06-10 MED ORDER — BACLOFEN 20 MG PO TABS: ORAL_TABLET | ORAL | Status: DC

## 2015-06-10 MED ORDER — HYDROCODONE-ACETAMINOPHEN 10-325 MG PO TABS: ORAL_TABLET | ORAL | Status: DC

## 2015-06-10 NOTE — Patient Instructions (Addendum)
Continue present medications baclofen and  hydrocodone acetaminophen       NO OXYCODONE  Radiofrequency rhizolysis lumbar facet to be performed at time return appointment. We will perform block of nerves to the sacroiliac joint if radiofrequency has not been approved by insurance  F/U PCP Dr. Clint GuyLindley for evaliation of  BP and general medical  condition  F/U surgical evaluation. May consider pending follow-up evaluations  Ask the nurses and secretaries if you have been approved for radiofrequency lumbar facet medial branch nerves  F/U neurological evaluation. May consider PNCV/EMG studies and other studies pending follow-up evaluations  May consider radiofrequency rhizolysis or intraspinal procedures pending response to present treatment and F/U evaluation   Patient to call Pain Management Center should patient have concerns prior to scheduled return appointmentRadiofrequency Lesioning Radiofrequency lesioning is a procedure that is performed to relieve pain. The procedure is often used for back, neck, or arm pain. Radiofrequency lesioning involves the use of a machine that creates radio waves to make heat. During the procedure, the heat is applied to the nerve that carries the pain signal. The heat damages the nerve and interferes with the pain signal. Pain relief usually lasts for 6 months to 1 year. LET [facility] PROVIDER KNOW ABOUT:  Any allergies you have.  All medicines you are taking, including vitamins, herbs, eye drops, creams, and over-the-counter medicines.  Previous problems you or members of your family have had with the use of anesthetics.  Any blood disorders you have.  Previous surgeries you have had.  Any medical conditions you have.  Whether you are pregnant or may be pregnant. RISKS AND COMPLICATIONS Generally, this is a safe procedure. However, problems may occur, including:  Pain or soreness at the injection site.  Infection at the injection  site.  Damage to nerves or blood vessels. BEFORE THE PROCEDURE  Ask your health care provider about:  Changing or stopping your regular medicines. This is especially important if you are taking diabetes medicines or blood thinners.  Taking medicines such as aspirin and ibuprofen. These medicines can thin your blood. Do not take these medicines before your procedure if your health care provider instructs you not to.  Follow instructions from your health care provider about eating or drinking restrictions.  Plan to have someone take you home after the procedure.  If you go home right after the procedure, plan to have someone with you for 24 hours. PROCEDURE  You will be given one or more of the following:  A medicine to help you relax (sedative).  A medicine to numb the area (local anesthetic).  You will be awake during the procedure. You will need to be able to talk with the health care provider during the procedure.  With the help of a type of X-ray (fluoroscopy), the health care provider will insert a radiofrequency needle into the area to be treated.  Next, a wire that carries the radio waves (electrode) will be put through the radiofrequency needle. An electrical pulse will be sent through the electrode to verify the correct nerve. You will feel a tingling sensation, and you may have muscle twitching.  Then, the tissue that is around the needle tip will be heated by an electric current that is passed using the radiofrequency machine. This will numb the nerves.  A bandage (dressing) will be put on the insertion area after the procedure is done. The procedure may vary among health care providers and hospitals. AFTER THE PROCEDURE  Your blood  pressure, heart rate, breathing rate, and blood oxygen level will be monitored often until the medicines you were given have worn off.  Return to your normal activities as directed by your health care provider.   This information is not  intended to replace advice given to you by your health care provider. Make sure you discuss any questions you have with your health care provider.   Document Released: 11/05/2010 Document Revised: 11/28/2014 Document Reviewed: 04/16/2014 Elsevier Interactive Patient Education [employer]

## 2015-06-10 NOTE — Progress Notes (Signed)
Safety precautions to be maintained throughout the outpatient stay will include: orient to surroundings, keep bed in low position, maintain call bell within reach at all times, provide assistance with transfer out of bed and ambulation.  

## 2015-06-10 NOTE — Progress Notes (Signed)
Subjective:    Patient ID: Kerri Fuentes, female    DOB: December 21, 1985, 30 y.o.   MRN: [id number]  HPI  The patient is a 30 year old female who comes to pain management center for follow-up evaluation and treatment of pain involving the lower back and lower extremity regions. The patient states that the pain recedes to the right buttocks and lower extremity there is aggravated by prolonged standing and walking. The patient continues present medications consisting of baclofen and hydrocodone acetaminophen. The patient is no longer taking ibuprofen. We discussed patient's condition and are awaiting insurance approval for radiofrequency rhizolysis lumbar facet medial branch nerves. The patient states the pain is severely incapacitating and wished to proceed with procedure block of nerves to the sacroiliac joint if the radiofrequency is not approved by insurance within the next couple of weeks. We will continue medications as prescribed and proceed with block of nerves to the sacroiliac joint if we are unable to get insurance approval for radiofrequency rhizolysis lumbar facet medial branch nerves on the right side at L5, L4, and L3. All agreed to suggested treatment plan     Review of Systems     Objective:   Physical Exam    There was tenderness to palpation of the paraspinal musculature region of the cervical region cervical facet region a mild degree with mild tenderness of the splenius capitis and occipitalis musculature regions. Palpation of the acromioclavicular and glenohumeral joint regions reproduce mild discomfort as well. The patient appeared to be with bilaterally equal grip strength and Tinel and Phalen's maneuver were without increase of pain of significant degree. There was tenderness of the thoracic facet thoracic paraspinal musculature region a mild degree and no crepitus of the thoracic region was noted. Palpation over the PSIS and PII S regions reproduce severe pain on the  right especially. There was tenderness over the lumbar facet lumbar paraspinal musculature region right greater than the left as well. Straight leg raising was tolerates approximately 30 without increased pain with dorsiflexion noted. There was negative clonus negative Homans. DTRs were difficult to elicit patient did have difficulty relaxing. Palpation of the greater trochanteric region iliotibial band region reproduced mild discomfort on the right. There was no sensory deficit of dermatomal distribution detected. There was negative clonus negative Homans. 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 We will perform block of nerves to the sacroiliac joint if radiofrequency has not been approved by insurance  F/U PCP Dr. Clint Guy for evaliation of  BP and general medical  condition  F/U surgical evaluation. May consider pending follow-up evaluations  Ask the nurses and secretaries if you have been approved for radiofrequency lumbar facet medial branch nerves  F/U neurological evaluation. May consider PNCV/EMG studies and other studies pending follow-up evaluations  May consider radiofrequency rhizolysis or intraspinal  procedures pending response to present  treatment and F/U evaluation   Patient to call Pain Management Center should patient have concerns prior to scheduled return appointment

## 2015-06-19 ENCOUNTER — Encounter: Payer: Self-pay | Admitting: Pain Medicine

## 2015-06-19 ENCOUNTER — Ambulatory Visit: Attending: Pain Medicine | Admitting: Pain Medicine

## 2015-06-19 VITALS — BP 119/81 | HR 70 | Temp 97.5°F | Resp 18 | Ht 68.0 in | Wt 130.0 lb

## 2015-06-19 DIAGNOSIS — M5416 Radiculopathy, lumbar region: Secondary | ICD-10-CM

## 2015-06-19 DIAGNOSIS — M47816 Spondylosis without myelopathy or radiculopathy, lumbar region: Secondary | ICD-10-CM

## 2015-06-19 DIAGNOSIS — M533 Sacrococcygeal disorders, not elsewhere classified: Secondary | ICD-10-CM | POA: Diagnosis not present

## 2015-06-19 DIAGNOSIS — M545 Low back pain: Secondary | ICD-10-CM | POA: Diagnosis present

## 2015-06-19 DIAGNOSIS — Q7649 Other congenital malformations of spine, not associated with scoliosis: Secondary | ICD-10-CM | POA: Diagnosis not present

## 2015-06-19 DIAGNOSIS — M5136 Other intervertebral disc degeneration, lumbar region: Secondary | ICD-10-CM | POA: Insufficient documentation

## 2015-06-19 DIAGNOSIS — M5481 Occipital neuralgia: Secondary | ICD-10-CM

## 2015-06-19 MED ORDER — MIDAZOLAM HCL 5 MG/5ML IJ SOLN: 5.0000 mg | Freq: Once | INTRAMUSCULAR | Status: DC

## 2015-06-19 MED ORDER — TRIAMCINOLONE ACETONIDE 40 MG/ML IJ SUSP
INTRAMUSCULAR | Status: AC
  Administered 2015-06-19: 11:00:00
  Filled 2015-06-19: qty 1

## 2015-06-19 MED ORDER — BUPIVACAINE HCL (PF) 0.25 % IJ SOLN: 30.0000 mL | Freq: Once | INTRAMUSCULAR | Status: DC

## 2015-06-19 MED ORDER — BUPIVACAINE HCL (PF) 0.25 % IJ SOLN
INTRAMUSCULAR | Status: AC
  Administered 2015-06-19: 11:00:00
  Filled 2015-06-19: qty 30

## 2015-06-19 MED ORDER — FENTANYL CITRATE (PF) 100 MCG/2ML IJ SOLN
INTRAMUSCULAR | Status: AC
  Administered 2015-06-19: 100 ug via INTRAVENOUS
  Filled 2015-06-19: qty 2

## 2015-06-19 MED ORDER — LACTATED RINGERS IV SOLN: 1000.0000 mL | INTRAVENOUS | Status: DC

## 2015-06-19 MED ORDER — ORPHENADRINE CITRATE 30 MG/ML IJ SOLN
INTRAMUSCULAR | Status: AC
  Administered 2015-06-19: 11:00:00
  Filled 2015-06-19: qty 2

## 2015-06-19 MED ORDER — TRIAMCINOLONE ACETONIDE 40 MG/ML IJ SUSP: 40.0000 mg | Freq: Once | INTRAMUSCULAR | Status: DC

## 2015-06-19 MED ORDER — MIDAZOLAM HCL 5 MG/5ML IJ SOLN
INTRAMUSCULAR | Status: AC
  Administered 2015-06-19: 5 mg via INTRAVENOUS
  Filled 2015-06-19: qty 5

## 2015-06-19 MED ORDER — ORPHENADRINE CITRATE 30 MG/ML IJ SOLN: 60.0000 mg | Freq: Once | INTRAMUSCULAR | Status: DC

## 2015-06-19 MED ORDER — FENTANYL CITRATE (PF) 100 MCG/2ML IJ SOLN: 100.0000 ug | Freq: Once | INTRAMUSCULAR | Status: DC

## 2015-06-19 NOTE — Progress Notes (Signed)
Subjective:    Patient ID: Kerri MoralesBrandy Leigh Maybury, female    DOB: 05/22/1985, 30 y.o.   MRN: [id number]  HPI  PROCEDURE:  Block of nerves to the sacroiliac joint.   NOTE:  The patient is a 30 y.o. female who returns to the Pain Management Center for further evaluation and treatment of pain involving the lower back and lower extremity region with pain in the region of the buttocks as well. Prior MRI studies reveal degenerative disc disease lumbar spine pseudoarthrosis of the right transverse process of L5 with the right sacral wing and sacroiliac joint (Bertolotti  Syndrome). . The patient is with reproduction of severe pain with palpation over the PSIS and PII S regions and is with positive Patrick's maneuver  There is concern regarding a significant component of the patient's pain being due to sacroiliac joint dysfunction The risks, benefits, expectations of the procedure have been discussed and explained to the patient who is understanding and willing to proceed with interventional treatment in attempt to decrease severity of patient's symptoms, minimize the risk of medication escalation and  hopefully retard the progression of the patient's symptoms. We will proceed with what is felt to be a medically necessary procedure, block of nerves to the sacroiliac joint.   DESCRIPTION OF PROCEDURE:  Block of nerves to the sacroiliac joint.   The patient was taken to the fluoroscopy suite. With the patient in the prone position with EKG, blood pressure, pulse and pulse oximetry monitoring, IV Versed, IV fentanyl conscious sedation, Betadine prep of proposed entry site was performed.   Block of nerves at the L5 vertebral body level.   With the patient in prone position, under fluoroscopic guidance, a 22 -gauge needle was inserted at the L5 vertebral body level on the left side. With 15 degrees oblique orientation a 22 -gauge needle was inserted in the region known as Burton's eye or eye of the Scotty dog.  Following documentation of needle placement in the area of Burton's eye or eye of the Scotty dog under fluoroscopic guidance, needle placement was then accomplished at the sacral ala level on the left side.   Needle placement at the sacral ala.   With the patient in prone position under fluoroscopic guidance with AP view of the lumbosacral spine, a 22 -gauge needle was inserted in the region known as the sacral ala on the left side. Following documentation of needle placement on the left side under fluoroscopic guidance needle placement was then accomplished at the S1 foramen level.   Needle placement at the S1 foramen level.   With the patient in prone position under fluoroscopic guidance with AP view of the lumbosacral spine and cephalad orientation, a 22 -gauge needle was inserted at the superior and lateral border of the S1 foramen on the left side. Following documentation of needle placement at the S1 foramen level on the left side, needle placement was then accomplished at the S2 foramen level on the left side.   Needle placement at the S2 foramen level.   With the patient in prone position with AP view of the lumbosacral spine with cephalad orientation, a 22 - gauge needle was inserted at the superior and lateral border of the S2 foramen under fluoroscopic guidance on the left side. Following needle placement at the L5 vertebral body level, sacral ala, S1 foramen and S2 foramen on the left side, needle placement was verified on lateral view under fluoroscopic guidance.  Following needle placement documentation on lateral view, each  needle was injected with 1 mL of 0.25% bupivacaine and Kenalog.   BLOCK OF THE NERVES TO SACROILIAC JOINT ON THE RIGHT SIDE The procedure was performed on the right side at the same levels as was performed on the left side and utilizing the same technique as on the left side and was performed under fluoroscopic guidance as on the left side   A total of  of  Kenalog was utilized for the procedure.   PLAN:  1. Medications: The patient will continue presently prescribed medications baclofen and hydrocodone acetaminophen.  2. The patient will be considered for modification of treatment regimen pending response to the procedure performed on today's visit.  3. The patient is to follow-up with primary care physician Dr. Clint Guy for evaluation of blood pressure and general medical condition following the procedure performed on today's visit.  4. Surgical evaluation as discussed.  5. Neurological evaluation as discussed.  6. The patient may be a candidate for radiofrequency procedures, implantation devices and other treatment pending response to treatment performed on today's visit and follow-up evaluation.  7. The patient has been advised to adhere to proper body mechanics and to avoid activities which may exacerbate the patient's symptoms.   Return appointment to Pain Management Center as scheduled.    Review of Systems     Objective:   Physical Exam        Assessment & Plan:

## 2015-06-19 NOTE — Progress Notes (Signed)
Patient here for bilateral block of nerves to the sacro iliac joint d/t lower back pain and bilateral hip pain. Safety precautions to be maintained throughout the outpatient stay will include: orient to surroundings, keep bed in low position, maintain call bell within reach at all times, provide assistance with transfer out of bed and ambulation.

## 2015-06-19 NOTE — Patient Instructions (Addendum)
PLAN   Continue present medications baclofen and  hydrocodone acetaminophen       NO OXYCODONE  F/U PCP Dr. Clint GuyLindley for evaliation of  BP and general medical  condition  F/U surgical evaluation. May consider pending follow-up evaluations  F/U neurological evaluation. May consider PNCV/EMG studies and other studies pending follow-up evaluations  May consider radiofrequency rhizolysis or intraspinal procedures pending response to present treatment and F/U evaluation   Patient to call Pain Management Center should patient have concerns prior to scheduled return appointmentPain Management Discharge Instructions  General Discharge Instructions :  If you need to reach your doctor call: Monday-Friday 8:00 am - 4:00 pm at [phone] or toll free [phone].  After clinic hours [phone] to have operator reach doctor.  Bring all of your medication bottles to all your appointments in the pain clinic.  To cancel or reschedule your appointment with Pain Management please remember to call 24 hours in advance to avoid a fee.  Refer to the educational materials which you have been given on: General Risks, I had my Procedure. Discharge Instructions, Post Sedation.  Post Procedure Instructions:  The drugs you were given will stay in your system until tomorrow, so for the next 24 hours you should not drive, make any legal decisions or drink any alcoholic beverages.  You may eat anything you prefer, but it is better to start with liquids then soups and crackers, and gradually work up to solid foods.  Please notify your doctor immediately if you have any unusual bleeding, trouble breathing or pain that is not related to your normal pain.  Depending on the type of procedure that was done, some parts of your body may feel week and/or numb.  This usually clears up by tonight or the next day.  Walk with the use of an assistive device or accompanied by an adult for the 24 hours.  You may use ice  on the affected area for the first 24 hours.  Put ice in a Ziploc bag and cover with a towel and place against area 15 minutes on 15 minutes off.  You may switch to heat after 24 hours.

## 2015-06-20 ENCOUNTER — Telehealth: Payer: Self-pay | Admitting: *Deleted

## 2015-06-20 NOTE — Telephone Encounter (Signed)
No problems post procedure. 

## 2015-07-01 ENCOUNTER — Ambulatory Visit: Attending: Pain Medicine | Admitting: Pain Medicine

## 2015-07-01 ENCOUNTER — Encounter: Payer: Self-pay | Admitting: Pain Medicine

## 2015-07-01 VITALS — BP 125/77 | HR 90 | Temp 98.8°F | Resp 18 | Ht 68.0 in | Wt 135.0 lb

## 2015-07-01 DIAGNOSIS — M5136 Other intervertebral disc degeneration, lumbar region: Secondary | ICD-10-CM | POA: Diagnosis not present

## 2015-07-01 DIAGNOSIS — M47816 Spondylosis without myelopathy or radiculopathy, lumbar region: Secondary | ICD-10-CM

## 2015-07-01 DIAGNOSIS — M545 Low back pain: Secondary | ICD-10-CM | POA: Insufficient documentation

## 2015-07-01 DIAGNOSIS — M533 Sacrococcygeal disorders, not elsewhere classified: Secondary | ICD-10-CM

## 2015-07-01 DIAGNOSIS — Q7649 Other congenital malformations of spine, not associated with scoliosis: Secondary | ICD-10-CM | POA: Diagnosis not present

## 2015-07-01 DIAGNOSIS — M79606 Pain in leg, unspecified: Secondary | ICD-10-CM | POA: Diagnosis present

## 2015-07-01 DIAGNOSIS — M5481 Occipital neuralgia: Secondary | ICD-10-CM | POA: Diagnosis not present

## 2015-07-01 DIAGNOSIS — G43909 Migraine, unspecified, not intractable, without status migrainosus: Secondary | ICD-10-CM | POA: Diagnosis not present

## 2015-07-01 DIAGNOSIS — M51369 Other intervertebral disc degeneration, lumbar region without mention of lumbar back pain or lower extremity pain: Secondary | ICD-10-CM

## 2015-07-01 DIAGNOSIS — M5416 Radiculopathy, lumbar region: Secondary | ICD-10-CM

## 2015-07-01 MED ORDER — HYDROCODONE-ACETAMINOPHEN 10-325 MG PO TABS: ORAL_TABLET | ORAL | Status: DC

## 2015-07-01 MED ORDER — BACLOFEN 20 MG PO TABS: ORAL_TABLET | ORAL | Status: DC

## 2015-07-01 NOTE — Progress Notes (Signed)
Safety precautions to be maintained throughout the outpatient stay will include: orient to surroundings, keep bed in low position, maintain call bell within reach at all times, provide assistance with transfer out of bed and ambulation.  

## 2015-07-01 NOTE — Progress Notes (Signed)
   Subjective:    Patient ID: Kerri MoralesBrandy Leigh Fuentes, female    DOB: 12/28/1985, 30 y.o.   MRN: [id number]  HPI   The patient is a 30 year old female who returns to pain management for further evaluation and treatment of pain involving the lower back and lower extremity region with pain radiating from the lower back of the buttocks on the left as well as on the right. The patient is without trauma change in events of daily living the call significant change in symptomatology. We have reviewed patient's MRI findings and have had some improvement of patient's pain with interventional treatment. There is concern regarding intraspinal abnormalities contributing to patient's symptomatology. At the present time we are going to proceed with radiofrequency rhizolysis lumbar facet medial branch nerves at time return appointment. The patient will continue her present medications including baclofen and hydrocodone acetaminophen. All agreed to suggested treatment plan.     Review of Systems     Objective:   Physical Exam  There was tenderness of the splenius capitis and occipitalis region a mild degree with mild to moderate tenderness over the cervical facet cervical paraspinal musculature region. Palpation of the acromioclavicular and glenohumeral joint regions reproduces minimal discomfort. The patient appeared to be with unremarkable Spurling's maneuver. The patient appeared to be with bilaterally equal grip strength and Tinel and Phalen's maneuver were without increased pain of significant degree. Palpation of the thoracic region thoracic facet region was with no crepitus of the thoracic region noted. Palpation over the lumbar paraspinal must reason lumbar facet region was attends to palpation of moderate to moderately severe degree with lateral bending rotation extension and palpation of the lumbar facets reproducing moderately severe discomfort right greater than the left there was tenderness of the PSIS  and PII S region of moderately severe degree. Palpation of the gluteal and piriformis muscles region reproduced moderately severe discomfort as well. There was mild tenderness of the greater trochanteric region and iliotibial band region. Straight leg raising was tolerates approximately 30 without increased pain with dorsiflexion noted. There was negative clonus negative Homans. No sensory deficit or dermatomal distribution detected. 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  F/U PCP Dr. Clint GuyLindley for evaliation of  BP and general medical  condition  F/U surgical evaluation. 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 May consider PNCV/EMG studies and other studies pending follow-up evaluations  May consider radiofrequency rhizolysis or intraspinal procedures pending response to present treatment and F/U evaluation   Patient to call Pain Management Center should patient have concerns prior to scheduled return appointment

## 2015-07-01 NOTE — Patient Instructions (Addendum)
Continue present medications baclofen and  hydrocodone acetaminophen       NO OXYCODONE  Radiofrequency rhizolysis lumbar facet to be performed at time return appointment.  F/U PCP Dr. Clint GuyLindley for evaliation of  BP and general medical  condition  F/U surgical evaluation. 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 May consider PNCV/EMG studies and other studies pending follow-up evaluations  May consider radiofrequency rhizolysis or intraspinal procedures pending response to present treatment and F/U evaluation   Patient to call Pain Management Center should patient have concerns prior to scheduled return appointment

## 2015-07-09 ENCOUNTER — Ambulatory Visit: Payer: Self-pay | Admitting: Pain Medicine

## 2015-07-15 ENCOUNTER — Telehealth: Payer: Self-pay | Admitting: *Deleted

## 2015-07-15 ENCOUNTER — Encounter: Payer: Self-pay | Admitting: Pain Medicine

## 2015-07-15 ENCOUNTER — Telehealth: Payer: Self-pay

## 2015-07-15 NOTE — Telephone Encounter (Signed)
Nurses Please have patient see her primary care physician or go to emergency room if she is having difficulty keeping things down. Also informed patient that we probably need to consider having her see surgeon again We can discuss her condition in further detail at time of return appointment Have the patient to see her primary care physician or go to emergency room if she has any change in her condition, any increased severity of her condition or other changes which required emergent treatment

## 2015-07-15 NOTE — Telephone Encounter (Signed)
Pt is in major pain. Pt is complaining of her back hurting [phone]

## 2015-07-15 NOTE — Telephone Encounter (Signed)
Spoke with patient and her c/o is that she is unable to get control of her pain.  She states that her last appt was on April 10,2017 at which time she discussed increasing her dosage of pain medicine to 5 tablets per day.  She states that she is hurting and experiencing nausea and having difficulty keeping things down.  I explained to her that typically we do not increase medications over the phone, her next appt is 07/30/2015

## 2015-07-15 NOTE — Telephone Encounter (Signed)
Patient advised to go to ED if necessary. Asking to see Dr. Metta Clinesrisp sooner. Spoke with Dr. Metta Clinesrisp, cannot see her until scheduled appt.

## 2015-07-15 NOTE — Telephone Encounter (Signed)
Message sent to Dr Metta Clinesrisp.

## 2015-07-16 ENCOUNTER — Telehealth: Payer: Self-pay

## 2015-07-16 ENCOUNTER — Telehealth: Payer: Self-pay | Admitting: *Deleted

## 2015-07-16 ENCOUNTER — Other Ambulatory Visit: Payer: Self-pay | Admitting: Pain Medicine

## 2015-07-16 DIAGNOSIS — M5416 Radiculopathy, lumbar region: Secondary | ICD-10-CM

## 2015-07-16 DIAGNOSIS — M47816 Spondylosis without myelopathy or radiculopathy, lumbar region: Secondary | ICD-10-CM

## 2015-07-16 DIAGNOSIS — M533 Sacrococcygeal disorders, not elsewhere classified: Secondary | ICD-10-CM

## 2015-07-16 DIAGNOSIS — M5136 Other intervertebral disc degeneration, lumbar region: Secondary | ICD-10-CM

## 2015-07-16 DIAGNOSIS — M5481 Occipital neuralgia: Secondary | ICD-10-CM

## 2015-07-16 NOTE — Telephone Encounter (Signed)
Pt says she went to pcp as Dr. Metta Clinesrisp instructed pcp does not know why Dr. Metta Clinesrisp sent pt to her when pt is under a pain contract with him. Pt wants someone to call her

## 2015-07-16 NOTE — Telephone Encounter (Signed)
Spoke with Gearldine BienenstockBrandy re; Dr Metta Clinesrisp response to her request.  She does [facility] d/t travel issues and restrictions.  Finally, we have decided to keep appt on Jul 30, 2015 for f/up and further evaluation with Dr Metta Clinesrisp.

## 2015-07-16 NOTE — Telephone Encounter (Signed)
Nurses, Dutch QuintShatoya and Olegario MessierKathy Please schedule patient for appointment for [facility] for lower back and lower extremity pain and inform patient of plan of treatment at this time. The patient also should go to the emergency room for any emergent and severe condition and have the emergency room physician called me to discuss her condition to assist with the management of her condition

## 2015-07-16 NOTE — Telephone Encounter (Signed)
Spoke with patient re; her visit to PCP Dr Clint GuyLindley.  Patient states that her PCP could not help her with her flare up, shje did note some inflammation in her back per the patient.  The patient is asking for her pain medication to be increased to 5 tablets per day instead of 4.  She does not like to take her muscle relaxer, but I did explain that the muscle relaxer might be a viable option during her 'flare ups".  Patient wants to know what she should do in order to manage her pain better.  I explained that we have talked to Dr Metta Clinesrisp on yesterday and he did not want to increase her medicine and the reason we had sent her to her PCP was because she c/o not being able to tolerate po intake.

## 2015-07-30 ENCOUNTER — Encounter: Payer: Self-pay | Admitting: Pain Medicine

## 2015-07-30 ENCOUNTER — Ambulatory Visit: Attending: Pain Medicine | Admitting: Pain Medicine

## 2015-07-30 VITALS — BP 140/93 | HR 89 | Temp 98.2°F | Resp 18 | Ht 68.0 in | Wt 135.0 lb

## 2015-07-30 DIAGNOSIS — M5416 Radiculopathy, lumbar region: Secondary | ICD-10-CM

## 2015-07-30 DIAGNOSIS — S32059A Unspecified fracture of fifth lumbar vertebra, initial encounter for closed fracture: Secondary | ICD-10-CM | POA: Diagnosis not present

## 2015-07-30 DIAGNOSIS — M5136 Other intervertebral disc degeneration, lumbar region: Secondary | ICD-10-CM

## 2015-07-30 DIAGNOSIS — Q7649 Other congenital malformations of spine, not associated with scoliosis: Secondary | ICD-10-CM | POA: Diagnosis not present

## 2015-07-30 DIAGNOSIS — M533 Sacrococcygeal disorders, not elsewhere classified: Secondary | ICD-10-CM

## 2015-07-30 DIAGNOSIS — M545 Low back pain: Secondary | ICD-10-CM | POA: Diagnosis present

## 2015-07-30 DIAGNOSIS — G43909 Migraine, unspecified, not intractable, without status migrainosus: Secondary | ICD-10-CM | POA: Insufficient documentation

## 2015-07-30 DIAGNOSIS — M79604 Pain in right leg: Secondary | ICD-10-CM | POA: Diagnosis present

## 2015-07-30 DIAGNOSIS — M5481 Occipital neuralgia: Secondary | ICD-10-CM | POA: Insufficient documentation

## 2015-07-30 DIAGNOSIS — X58XXXA Exposure to other specified factors, initial encounter: Secondary | ICD-10-CM | POA: Diagnosis not present

## 2015-07-30 DIAGNOSIS — M47816 Spondylosis without myelopathy or radiculopathy, lumbar region: Secondary | ICD-10-CM

## 2015-07-30 MED ORDER — HYDROCODONE-ACETAMINOPHEN 10-325 MG PO TABS: ORAL_TABLET | ORAL | Status: DC

## 2015-07-30 MED ORDER — BACLOFEN 20 MG PO TABS: ORAL_TABLET | ORAL | Status: DC

## 2015-07-30 NOTE — Patient Instructions (Addendum)
Continue present medications baclofen and  hydrocodone acetaminophen       NO OXYCODONE  Radiofrequency rhizolysis lumbar facet, medial branch nerves to be performed at time of return appointment  F/U PCP Dr. Clint GuyLindley for evaliation of  BP and general medical  condition  F/U surgical evaluation. 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 May consider PNCV/EMG studies and other studies pending follow-up evaluations  May consider radiofrequency rhizolysis or intraspinal procedures pending response to present treatment and F/U evaluation   Patient to call [facility] should patient have concerns prior to scheduled return appointmentRadiofrequency Lesioning Radiofrequency lesioning is a procedure that is performed to relieve pain. The procedure is often used for back, neck, or arm pain. Radiofrequency lesioning involves the use of a machine that creates radio waves to make heat. During the procedure, the heat is applied to the nerve that carries the pain signal. The heat damages the nerve and interferes with the pain signal. Pain relief usually lasts for 6 months to 1 year. LET [facility] KNOW ABOUT:  Any allergies you have.  All medicines you are taking, including vitamins, herbs, eye drops, creams, and over-the-counter medicines.  Previous problems you or members of your family have had with the use of anesthetics.  Any blood disorders you have.  Previous surgeries you have had.  Any medical conditions you have.  Whether you are pregnant or may be pregnant. RISKS AND COMPLICATIONS Generally, this is a safe procedure. However, problems may occur, including:  Pain or soreness at the injection  site.  Infection at the injection site.  Damage to nerves or blood vessels. BEFORE THE PROCEDURE  Ask your health care provider about:  Changing or stopping your regular medicines. This is especially important if you are taking diabetes medicines or blood thinners.  Taking medicines such as aspirin and ibuprofen. These medicines can thin your blood. Do not take these medicines before your procedure if your health care provider instructs you not to.  Follow instructions from your health care provider about eating or drinking restrictions.  Plan to have someone take you home after the procedure.  If you go home right after the procedure, plan to have someone with you for 24 hours. PROCEDURE  You will be given one or more of the following:  A medicine to help you relax (sedative).  A medicine to numb the area (local anesthetic).  You will be awake during the procedure. You will need to be able to talk with the health care provider during the procedure.  With the help of a type of X-ray (fluoroscopy), the health care provider will insert a radiofrequency needle into the area to be treated.  Next, a wire that carries the radio waves (electrode) will be put through the radiofrequency needle. An electrical pulse will be sent through the electrode to verify the correct nerve. You will feel a tingling sensation, and you may have muscle twitching.  Then, the tissue that is around the needle tip will be heated by an electric current that is passed using the radiofrequency machine. This will numb the nerves.  A bandage (dressing) will be put on the insertion area after the procedure is done. The procedure may vary among health care  providers and hospitals. AFTER THE PROCEDURE  Your blood pressure, heart rate, breathing rate, and blood oxygen level will be monitored often until the medicines you were given have worn off.  Return to your normal activities as directed by your health care  provider.   This information is not intended to replace advice given to you by your health care provider. Make sure you discuss any questions you have with your health care provider.   Document Released: 11/05/2010 Document Revised: 11/28/2014 Document Reviewed: 04/16/2014 Elsevier Interactive Patient Education 2016 [employer] GENERAL RISKS AND COMPLICATIONS  What are the risk, side effects and possible complications? Generally speaking, most procedures are safe.  However, with any procedure there are risks, side effects, and the possibility of complications.  The risks and complications are dependent upon the sites that are lesioned, or the type of nerve block to be performed.  The closer the procedure is to the spine, the more serious the risks are.  Great care is taken when placing the radio frequency needles, block needles or lesioning probes, but sometimes complications can occur.  Infection: Any time there is an injection through the skin, there is a risk of infection.  This is why sterile conditions are used for these blocks.  There are four possible types of infection.  Localized skin infection.  Central Nervous System Infection-This can be in the form of Meningitis, which can be deadly.  Epidural Infections-This can be in the form of an epidural abscess, which can cause pressure inside of the spine, causing compression of the spinal cord with subsequent paralysis. This would require an emergency surgery to decompress, and there are no guarantees that the patient would recover from the paralysis.  Discitis-This is an infection of the intervertebral discs.  It occurs in about 1% of discography procedures.  It is difficult to treat and it may lead to surgery.        2. Pain: the needles have to go through skin and soft tissues, will cause soreness.       3. Damage to internal structures:  The nerves to be lesioned may be near blood vessels or    other nerves which can be potentially  damaged.       4. Bleeding: Bleeding is more common if the patient is taking blood thinners such as  aspirin, Coumadin, Ticiid, Plavix, etc., or if he/she have some genetic predisposition  such as hemophilia. Bleeding into the spinal canal can cause compression of the spinal  cord with subsequent paralysis.  This would require an emergency surgery to  decompress and there are no guarantees that the patient would recover from the  paralysis.       5. Pneumothorax:  Puncturing of a lung is a possibility, every time a needle is introduced in  the area of the chest or upper back.  Pneumothorax refers to free air around the  collapsed lung(s), inside of the thoracic cavity (chest cavity).  Another two possible  complications related to a similar event would include: Hemothorax and Chylothorax.   These are variations of the Pneumothorax, where instead of air around the collapsed  lung(s), you may have blood or chyle, respectively.       6. Spinal headaches: They may occur with any procedures in the area of the spine.       7. Persistent CSF (Cerebro-Spinal Fluid) leakage: This is a rare problem, but may occur  with prolonged intrathecal or epidural catheters either due to the formation  of a fistulous  track or a dural tear.       8. Nerve damage: By working so close to the spinal cord, there is always a possibility of  nerve damage, which could be as serious as a permanent spinal cord injury with  paralysis.       9. Death:  Although rare, severe deadly allergic reactions known as "Anaphylactic  reaction" can occur to any of the medications used.      10. Worsening of the symptoms:  We can always make thing worse.  What are the chances of something like this happening? Chances of any of this occuring are extremely low.  By statistics, you have more of a chance of getting killed in a motor vehicle accident: while driving to the hospital than any of the above occurring .  Nevertheless, you should be aware that  they are possibilities.  In general, it is similar to taking a shower.  Everybody knows that you can slip, hit your head and get killed.  Does that mean that you should not shower again?  Nevertheless always keep in mind that statistics do not mean anything if you happen to be on the wrong side of them.  Even if a procedure has a 1 (one) in a 1,000,000 (million) chance of going wrong, it you happen to be that one..Also, keep in mind that by statistics, you have more of a chance of having something go wrong when taking medications.  Who should not have this procedure? If you are on a blood thinning medication (e.g. Coumadin, Plavix, see list of "Blood Thinners"), or if you have an active infection going on, you should not have the procedure.  If you are taking any blood thinners, please inform your physician.  How should I prepare for this procedure?  Do not eat or drink anything at least six hours prior to the procedure.  Bring a driver with you .  It cannot be a taxi.  Come accompanied by an adult that can drive you back, and that is strong enough to help you if your legs get weak or numb from the local anesthetic.  Take all of your medicines the morning of the procedure with just enough water to swallow them.  If you have diabetes, make sure that you are scheduled to have your procedure done first thing in the morning, whenever possible.  If you have diabetes, take only half of your insulin dose and notify our nurse that you have done so as soon as you arrive at the clinic.  If you are diabetic, but only take blood sugar pills (oral hypoglycemic), then do not take them on the morning of your procedure.  You may take them after you have had the procedure.  Do not take aspirin or any aspirin-containing medications, at least eleven (11) days prior to the procedure.  They may prolong bleeding.  Wear loose fitting clothing that may be easy to take off and that you would not mind if it got stained  with Betadine or blood.  Do not wear any jewelry or perfume  Remove any nail coloring.  It will interfere with some of our monitoring equipment.  NOTE: Remember that this is not meant to be interpreted as a complete list of all possible complications.  Unforeseen problems may occur.  BLOOD THINNERS The following drugs contain aspirin or other products, which can cause increased bleeding during surgery and should not be taken for 2 weeks prior to and  1 week after surgery.  If you should need take something for relief of minor pain, you may take acetaminophen which is found in Tylenol,m Datril, Anacin-3 and Panadol. It is not blood thinner. The products listed below are.  Do not take any of the products listed below in addition to any listed on your instruction sheet.  A.P.C or A.P.C with Codeine Codeine Phosphate Capsules #3 Ibuprofen Ridaura  ABC compound Congesprin Imuran rimadil  Advil Cope Indocin Robaxisal  Alka-Seltzer Effervescent Pain Reliever and Antacid Coricidin or Coricidin-D  Indomethacin Rufen  Alka-Seltzer plus Cold Medicine Cosprin Ketoprofen S-A-C Tablets  Anacin Analgesic Tablets or Capsules Coumadin Korlgesic Salflex  Anacin Extra Strength Analgesic tablets or capsules CP-2 Tablets Lanoril Salicylate  Anaprox Cuprimine Capsules Levenox Salocol  Anexsia-D Dalteparin Magan Salsalate  Anodynos Darvon compound Magnesium Salicylate Sine-off  Ansaid Dasin Capsules Magsal Sodium Salicylate  Anturane Depen Capsules Marnal Soma  APF Arthritis pain formula Dewitt's Pills Measurin Stanback  Argesic Dia-Gesic Meclofenamic Sulfinpyrazone  Arthritis Bayer Timed Release Aspirin Diclofenac Meclomen Sulindac  Arthritis pain formula Anacin Dicumarol Medipren Supac  Analgesic (Safety coated) Arthralgen Diffunasal Mefanamic Suprofen  Arthritis Strength Bufferin Dihydrocodeine Mepro Compound Suprol  Arthropan liquid Dopirydamole Methcarbomol with Aspirin Synalgos  ASA tablets/Enseals  Disalcid Micrainin Tagament  Ascriptin Doan's Midol Talwin  Ascriptin A/D Dolene Mobidin Tanderil  Ascriptin Extra Strength Dolobid Moblgesic Ticlid  Ascriptin with Codeine Doloprin or Doloprin with Codeine Momentum Tolectin  Asperbuf Duoprin Mono-gesic Trendar  Aspergum Duradyne Motrin or Motrin IB Triminicin  Aspirin plain, buffered or enteric coated Durasal Myochrisine Trigesic  Aspirin Suppositories Easprin Nalfon Trillsate  Aspirin with Codeine Ecotrin Regular or Extra Strength Naprosyn Uracel  Atromid-S Efficin Naproxen Ursinus  Auranofin Capsules Elmiron Neocylate Vanquish  Axotal Emagrin Norgesic Verin  Azathioprine Empirin or Empirin with Codeine Normiflo Vitamin E  Azolid Emprazil Nuprin Voltaren  Bayer Aspirin plain, buffered or children's or timed BC Tablets or powders Encaprin Orgaran Warfarin Sodium  Buff-a-Comp Enoxaparin Orudis Zorpin  Buff-a-Comp with Codeine Equegesic Os-Cal-Gesic   Buffaprin Excedrin plain, buffered or Extra Strength Oxalid   Bufferin Arthritis Strength Feldene Oxphenbutazone   Bufferin plain or Extra Strength Feldene Capsules Oxycodone with Aspirin   Bufferin with Codeine Fenoprofen Fenoprofen Pabalate or Pabalate-SF   Buffets II Flogesic Panagesic   Buffinol plain or Extra Strength Florinal or Florinal with Codeine Panwarfarin   Buf-Tabs Flurbiprofen Penicillamine   Butalbital Compound Four-way cold tablets Penicillin   Butazolidin Fragmin Pepto-Bismol   Carbenicillin Geminisyn Percodan   Carna Arthritis Reliever Geopen Persantine   Carprofen Gold's salt Persistin   Chloramphenicol Goody's Phenylbutazone   Chloromycetin Haltrain Piroxlcam   Clmetidine heparin Plaquenil   Cllnoril Hyco-pap Ponstel   Clofibrate Hydroxy chloroquine Propoxyphen         Before stopping any of these medications, be sure to consult the physician who ordered them.  Some, such as Coumadin (Warfarin) are ordered to prevent or treat serious conditions such as "deep  thrombosis", "pumonary embolisms", and other heart problems.  The amount of time that you may need off of the medication may also vary with the medication and the reason for which you were taking it.  If you are taking any of these medications, please make sure you notify your pain physician before you undergo any procedures.

## 2015-07-30 NOTE — Progress Notes (Signed)
Safety precautions to be maintained throughout the outpatient stay will include: orient to surroundings, keep bed in low position, maintain call bell within reach at all times, provide assistance with transfer out of bed and ambulation.  

## 2015-07-30 NOTE — Progress Notes (Signed)
Subjective:    Patient ID: Kerri MoralesBrandy Leigh Esper, female    DOB: 04/20/1985, 30 y.o.   MRN: [id number]  HPI  The patient is a 30 year old female who returns to pain management for further evaluation and treatment of pain involving the lower back and lower extremity region predominantly with pain radiating to the right lower extremity predominantly and the region of the right buttocks. She has had improvement of her pain with interventional treatment performed in pain management Center. The patient has had occasional pain occurring in the reason the neck with headaches as well as the present time we discussed patient undergoing further evaluation including neurosurgical evaluation for further assessment of patient's condition. We'll discuss proceeding with radiofrequency rhizolysis lumbar facet, medial branch nerve radiofrequency rhizolysis in attempt to decrease severity of symptoms, minimize progression of symptoms, and avoid the need for more involved treatment. The patient will continue medications consisting of baclofen and hydrocodone acetaminophen as prescribed at this time. We will proceed with greater freaks rhizolysis lumbar facet, medial branch nerves, to be performed at time return appointment pending insurance approval. The patient is undergone neurosurgical reevaluation as discussed and as well. All and agreement suggested treatment plan.  Review of Systems     Objective:   Physical Exam  There was tenderness to palpation of the splenius capitis and occipitalis muscles region a mild degree with mild tenderness of the cervical facet cervical paraspinal musculature region. Palpation of the splenius capitis and occipitalis musculature regions reproduced pain on the left as well as on the right. Palpation over the cervical facet cervical paraspinal musculature region reproduced pain of moderate degree on the left as well as on the right. The patient was attends to palpation of the  acromioclavicular and glenohumeral joint regions of mild to moderate degree with no significant abnormalities noted with Spurling's maneuver. The patient appeared to be with bilaterally equal grip strength and Tinel Tinel and Phalen's maneuver were without increased pain of significant degree. Palpation over the region of the thoracic region thoracic facet region was with his crepitus of the thoracic region noted. Palpation over the lumbar paraspinal must reason lumbar facet region associated with moderately severe discomfort with lateral bending rotation extension and palpation of the lumbar facets reproducing moderately severe discomfort right greater than left with severe tenderness to palpation over the PSIS and PII S region as well as the gluteal and piriformis musculature region right greater than left. Palpation of the greater greater trochanteric region iliotibial band region was attends to palpation of moderate degree straight leg raise was tolerates approximately 30 without a definite increased pain with dorsiflexion noted. There appeared to be negative clonus negative Homans. DTRs appeared to be trace at the knees. The nontender with no costovertebral tenderness noted.      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 Clint GuyLindley for evaliation of  BP and general medical  condition  F/U surgical evaluation. 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 May consider PNCV/EMG studies and other studies pending follow-up evaluations  May consider radiofrequency rhizolysis or intraspinal procedures pending response to present treatment and F/U evaluation   Patient to call Pain Management Center should patient have concerns prior to scheduled return appointment

## 2015-08-14 ENCOUNTER — Ambulatory Visit: Payer: Self-pay | Admitting: Pain Medicine

## 2015-08-20 ENCOUNTER — Ambulatory Visit: Payer: Self-pay | Admitting: Pain Medicine

## 2015-08-26 ENCOUNTER — Ambulatory Visit: Attending: Pain Medicine | Admitting: Pain Medicine

## 2015-08-26 ENCOUNTER — Encounter: Payer: Self-pay | Admitting: Pain Medicine

## 2015-08-26 VITALS — BP 124/80 | HR 75 | Temp 97.9°F | Resp 16 | Ht 68.0 in | Wt 130.0 lb

## 2015-08-26 DIAGNOSIS — M533 Sacrococcygeal disorders, not elsewhere classified: Secondary | ICD-10-CM

## 2015-08-26 DIAGNOSIS — M47816 Spondylosis without myelopathy or radiculopathy, lumbar region: Secondary | ICD-10-CM

## 2015-08-26 DIAGNOSIS — M5136 Other intervertebral disc degeneration, lumbar region: Secondary | ICD-10-CM | POA: Insufficient documentation

## 2015-08-26 DIAGNOSIS — Q7649 Other congenital malformations of spine, not associated with scoliosis: Secondary | ICD-10-CM | POA: Insufficient documentation

## 2015-08-26 DIAGNOSIS — G43909 Migraine, unspecified, not intractable, without status migrainosus: Secondary | ICD-10-CM | POA: Diagnosis not present

## 2015-08-26 DIAGNOSIS — M545 Low back pain: Secondary | ICD-10-CM | POA: Diagnosis present

## 2015-08-26 DIAGNOSIS — M5481 Occipital neuralgia: Secondary | ICD-10-CM | POA: Insufficient documentation

## 2015-08-26 DIAGNOSIS — M5416 Radiculopathy, lumbar region: Secondary | ICD-10-CM

## 2015-08-26 DIAGNOSIS — M79606 Pain in leg, unspecified: Secondary | ICD-10-CM | POA: Diagnosis present

## 2015-08-26 MED ORDER — HYDROCODONE-ACETAMINOPHEN 10-325 MG PO TABS: ORAL_TABLET | ORAL | Status: DC

## 2015-08-26 MED ORDER — BACLOFEN 20 MG PO TABS: ORAL_TABLET | ORAL | Status: DC

## 2015-08-26 NOTE — Patient Instructions (Addendum)
Continue present medications baclofen and  hydrocodone acetaminophen       NO OXYCODONE  Lumbar facet, medial branch nerve radiofrequency rhizolysis to be performed at time return appointment  F/U PCP Dr. Clint GuyLindley for evaliation of  BP and general medical  condition  F/U surgical evaluation. 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 May consider PNCV/EMG studies and other studies pending follow-up evaluations  May consider radiofrequency rhizolysis or intraspinal procedures pending response to present treatment and F/U evaluation  Patient to call [facility] should patient have concerns prior to scheduled return appointmentRadiofrequency Lesioning Radiofrequency lesioning is a procedure that is performed to relieve pain. The procedure is often used for back, neck, or arm pain. Radiofrequency lesioning involves the use of a machine that creates radio waves to make heat. During the procedure, the heat is applied to the nerve that carries the pain signal. The heat damages the nerve and interferes with the pain signal. Pain relief usually lasts for 6 months to 1 year. LET [facility] PROVIDER KNOW ABOUT:  Any allergies you have.  All medicines you are taking, including vitamins, herbs, eye drops, creams, and over-the-counter medicines.  Previous problems you or members of your family have had with the use of anesthetics.  Any blood disorders you have.  Previous surgeries you have had.  Any medical conditions you have.  Whether you are pregnant or may be pregnant. RISKS AND COMPLICATIONS Generally, this is a safe procedure. However, problems may occur, including:  Pain or soreness at the injection  site.  Infection at the injection site.  Damage to nerves or blood vessels. BEFORE THE PROCEDURE  Ask your health care provider about:  Changing or stopping your regular medicines. This is especially important if you are taking diabetes medicines or blood thinners.  Taking medicines such as aspirin and ibuprofen. These medicines can thin your blood. Do not take these medicines before your procedure if your health care provider instructs you not to.  Follow instructions from your health care provider about eating or drinking restrictions.  Plan to have someone take you home after the procedure.  If you go home right after the procedure, plan to have someone with you for 24 hours. PROCEDURE  You will be given one or more of the following:  A medicine to help you relax (sedative).  A medicine to numb the area (local anesthetic).  You will be awake during the procedure. You will need to be able to talk with the health care provider during the procedure.  With the help of a type of X-ray (fluoroscopy), the health care provider will insert a radiofrequency needle into the area to be treated.  Next, a wire that carries the radio waves (electrode) will be put through the radiofrequency needle. An electrical pulse will be sent through the electrode to verify the correct nerve. You will feel a tingling sensation, and you may have muscle twitching.  Then, the tissue that is around the needle tip will be heated by an electric current that is passed using the radiofrequency machine. This will numb the nerves.  A bandage (dressing) will be put on the insertion area after the procedure is done. The procedure may vary among health care providers and  hospitals. AFTER THE PROCEDURE  Your blood pressure, heart rate, breathing rate, and blood oxygen level will be monitored often until the medicines you were given have worn off.  Return to your normal activities as directed by your health care  provider.   This information is not intended to replace advice given to you by your health care provider. Make sure you discuss any questions you have with your health care provider.   Document Released: 11/05/2010 Document Revised: 11/28/2014 Document Reviewed: 04/16/2014 Elsevier Interactive Patient Education 2016 [employer] Facet Blocks Patient Information  Description: The facets are joints in the spine between the vertebrae.  Like any joints in the body, facets can become irritated and painful.  Arthritis can also effect the facets.  By injecting steroids and local anesthetic in and around these joints, we can temporarily block the nerve supply to them.  Steroids act directly on irritated nerves and tissues to reduce selling and inflammation which often leads to decreased pain.  Facet blocks may be done anywhere along the spine from the neck to the low back depending upon the location of your pain.   After numbing the skin with local anesthetic (like Novocaine), a small needle is passed onto the facet joints under x-ray guidance.  You may experience a sensation of pressure while this is being done.  The entire block usually lasts about 15-25 minutes.   Conditions which may be treated by facet blocks:   Low back/buttock pain  Neck/shoulder pain  Certain types of headaches  Preparation for the injection:   Do not eat any solid food or dairy products within 8 hours of your appointment.  You may drink clear liquid up to 3 hours before appointment.  Clear liquids include water, black coffee, juice or soda.  No milk or cream please.  You may take your regular medication, including pain medications, with a sip of water before your appointment.  Diabetics should hold regular insulin (if taken separately) and take 1/2 normal NPH dose the morning of the procedure.  Carry some sugar containing items with you to your appointment.  A driver must accompany you and be prepared to drive you  home after your procedure.  Bring all your current medications with you.  An IV may be inserted and sedation may be given at the discretion of the physician.  A blood pressure cuff, EKG and other monitors will often be applied during the procedure.  Some patients may need to have extra oxygen administered for a short period.  You will be asked to provide medical information, including your allergies and medications, prior to the procedure.  We must know immediately if you are taking blood thinners (like Coumadin/Warfarin) or if you are allergic to IV iodine contrast (dye).  We must know if you could possible be pregnant.  Possible side-effects:   Bleeding from needle site  Infection (rare, may require surgery)  Nerve injury (rare)  Numbness & tingling (temporary)  Difficulty urinating (rare, temporary)  Spinal headache (a headache worse with upright posture)  Light-headedness (temporary)  Pain at injection site (serveral days)  Decreased blood pressure (rare, temporary)  Weakness in arm/leg (temporary)  Pressure sensation in back/neck (temporary)   Call if you experience:   Fever/chills associated with headache or increased back/neck pain  Headache worsened by an upright position  New onset, weakness or numbness of an extremity below the injection site  Hives or difficulty breathing (go to the emergency room)  Inflammation or drainage at the  injection site(s)  Severe back/neck pain greater than usual  New symptoms which are concerning to you  Please note:  Although the local anesthetic injected can often make your back or neck feel good for several hours after the injection, the pain will likely return. It takes 3-7 days for steroids to work.  You may not notice any pain relief for at least one week.  If effective, we will often do a series of 2-3 injections spaced 3-6 weeks apart to maximally decrease your pain.  After the initial series, you may be a  candidate for a more permanent nerve block of the facets.  If you have any questions, please call #[phone] [facility] Clinic[facility] R[facility]KS AND COMPLICATIONS  What are the risk, side effects and possible complications? Generally speaking, most procedures are safe.  However, with any procedure there are risks, side effects, and the possibility of complications.  The risks and complications are dependent upon the sites that are lesioned, or the type of nerve block to be performed.  The closer the procedure is to the spine, the more serious the risks are.  Great care is taken when placing the radio frequency needles, block needles or lesioning probes, but sometimes complications can occur.  Infection: Any time there is an injection through the skin, there is a risk of infection.  This is why sterile conditions are used for these blocks.  There are four possible types of infection.  Localized skin infection.  Central Nervous System Infection-This can be in the form of Meningitis, which can be deadly.  Epidural Infections-This can be in the form of an epidural abscess, which can cause pressure inside of the spine, causing compression of the spinal cord with subsequent paralysis. This would require an emergency surgery to decompress, and there are no guarantees that the patient would recover from the paralysis.  Discitis-This is an infection of the intervertebral discs.  It occurs in about 1% of discography procedures.  It is difficult to treat and it may lead to surgery.        2. Pain: the needles have to go through skin and soft tissues, will cause soreness.       3. Damage to internal structures:  The nerves to be lesioned may be near blood vessels or    other nerves which can be potentially damaged.       4. Bleeding: Bleeding is more common if the patient is taking blood thinners such as  aspirin, Coumadin, Ticiid, Plavix, etc., or if he/she have some genetic  predisposition  such as hemophilia. Bleeding into the spinal canal can cause compression of the spinal  cord with subsequent paralysis.  This would require an emergency surgery to  decompress and there are no guarantees that the patient would recover from the  paralysis.       5. Pneumothorax:  Puncturing of a lung is a possibility, every time a needle is introduced in  the area of the chest or upper back.  Pneumothorax refers to free air around the  collapsed lung(s), inside of the thoracic cavity (chest cavity).  Another two possible  complications related to a similar event would include: Hemothorax and Chylothorax.   These are variations of the Pneumothorax, where instead of air around the collapsed  lung(s), you may have blood or chyle, respectively.       6. Spinal headaches: They may occur with any procedures in the area of the spine.  7. Persistent CSF (Cerebro-Spinal Fluid) leakage: This is a rare problem, but may occur  with prolonged intrathecal or epidural catheters either due to the formation of a fistulous  track or a dural tear.       8. Nerve damage: By working so close to the spinal cord, there is always a possibility of  nerve damage, which could be as serious as a permanent spinal cord injury with  paralysis.       9. Death:  Although rare, severe deadly allergic reactions known as "Anaphylactic  reaction" can occur to any of the medications used.      10. Worsening of the symptoms:  We can always make thing worse.  What are the chances of something like this happening? Chances of any of this occuring are extremely low.  By statistics, you have more of a chance of getting killed in a motor vehicle accident: while driving to the hospital than any of the above occurring .  Nevertheless, you should be aware that they are possibilities.  In general, it is similar to taking a shower.  Everybody knows that you can slip, hit your head and get killed.  Does that mean that you should not  shower again?  Nevertheless always keep in mind that statistics do not mean anything if you happen to be on the wrong side of them.  Even if a procedure has a 1 (one) in a 1,000,000 (million) chance of going wrong, it you happen to be that one..Also, keep in mind that by statistics, you have more of a chance of having something go wrong when taking medications.  Who should not have this procedure? If you are on a blood thinning medication (e.g. Coumadin, Plavix, see list of "Blood Thinners"), or if you have an active infection going on, you should not have the procedure.  If you are taking any blood thinners, please inform your physician.  How should I prepare for this procedure?  Do not eat or drink anything at least six hours prior to the procedure.  Bring a driver with you .  It cannot be a taxi.  Come accompanied by an adult that can drive you back, and that is strong enough to help you if your legs get weak or numb from the local anesthetic.  Take all of your medicines the morning of the procedure with just enough water to swallow them.  If you have diabetes, make sure that you are scheduled to have your procedure done first thing in the morning, whenever possible.  If you have diabetes, take only half of your insulin dose and notify our nurse that you have done so as soon as you arrive at the clinic.  If you are diabetic, but only take blood sugar pills (oral hypoglycemic), then do not take them on the morning of your procedure.  You may take them after you have had the procedure.  Do not take aspirin or any aspirin-containing medications, at least eleven (11) days prior to the procedure.  They may prolong bleeding.  Wear loose fitting clothing that may be easy to take off and that you would not mind if it got stained with Betadine or blood.  Do not wear any jewelry or perfume  Remove any nail coloring.  It will interfere with some of our monitoring equipment.  NOTE: Remember that  this is not meant to be interpreted as a complete list of all possible complications.  Unforeseen problems may occur.  BLOOD THINNERS  The following drugs contain aspirin or other products, which can cause increased bleeding during surgery and should not be taken for 2 weeks prior to and 1 week after surgery.  If you should need take something for relief of minor pain, you may take acetaminophen which is found in Tylenol,m Datril, Anacin-3 and Panadol. It is not blood thinner. The products listed below are.  Do not take any of the products listed below in addition to any listed on your instruction sheet.  A.P.C or A.P.C with Codeine Codeine Phosphate Capsules #3 Ibuprofen Ridaura  ABC compound Congesprin Imuran rimadil  Advil Cope Indocin Robaxisal  Alka-Seltzer Effervescent Pain Reliever and Antacid Coricidin or Coricidin-D  Indomethacin Rufen  Alka-Seltzer plus Cold Medicine Cosprin Ketoprofen S-A-C Tablets  Anacin Analgesic Tablets or Capsules Coumadin Korlgesic Salflex  Anacin Extra Strength Analgesic tablets or capsules CP-2 Tablets Lanoril Salicylate  Anaprox Cuprimine Capsules Levenox Salocol  Anexsia-D Dalteparin Magan Salsalate  Anodynos Darvon compound Magnesium Salicylate Sine-off  Ansaid Dasin Capsules Magsal Sodium Salicylate  Anturane Depen Capsules Marnal Soma  APF Arthritis pain formula Dewitt's Pills Measurin Stanback  Argesic Dia-Gesic Meclofenamic Sulfinpyrazone  Arthritis Bayer Timed Release Aspirin Diclofenac Meclomen Sulindac  Arthritis pain formula Anacin Dicumarol Medipren Supac  Analgesic (Safety coated) Arthralgen Diffunasal Mefanamic Suprofen  Arthritis Strength Bufferin Dihydrocodeine Mepro Compound Suprol  Arthropan liquid Dopirydamole Methcarbomol with Aspirin Synalgos  ASA tablets/Enseals Disalcid Micrainin Tagament  Ascriptin Doan's Midol Talwin  Ascriptin A/D Dolene Mobidin Tanderil  Ascriptin Extra Strength Dolobid Moblgesic Ticlid  Ascriptin with Codeine  Doloprin or Doloprin with Codeine Momentum Tolectin  Asperbuf Duoprin Mono-gesic Trendar  Aspergum Duradyne Motrin or Motrin IB Triminicin  Aspirin plain, buffered or enteric coated Durasal Myochrisine Trigesic  Aspirin Suppositories Easprin Nalfon Trillsate  Aspirin with Codeine Ecotrin Regular or Extra Strength Naprosyn Uracel  Atromid-S Efficin Naproxen Ursinus  Auranofin Capsules Elmiron Neocylate Vanquish  Axotal Emagrin Norgesic Verin  Azathioprine Empirin or Empirin with Codeine Normiflo Vitamin E  Azolid Emprazil Nuprin Voltaren  Bayer Aspirin plain, buffered or children's or timed BC Tablets or powders Encaprin Orgaran Warfarin Sodium  Buff-a-Comp Enoxaparin Orudis Zorpin  Buff-a-Comp with Codeine Equegesic Os-Cal-Gesic   Buffaprin Excedrin plain, buffered or Extra Strength Oxalid   Bufferin Arthritis Strength Feldene Oxphenbutazone   Bufferin plain or Extra Strength Feldene Capsules Oxycodone with Aspirin   Bufferin with Codeine Fenoprofen Fenoprofen Pabalate or Pabalate-SF   Buffets II Flogesic Panagesic   Buffinol plain or Extra Strength Florinal or Florinal with Codeine Panwarfarin   Buf-Tabs Flurbiprofen Penicillamine   Butalbital Compound Four-way cold tablets Penicillin   Butazolidin Fragmin Pepto-Bismol   Carbenicillin Geminisyn Percodan   Carna Arthritis Reliever Geopen Persantine   Carprofen Gold's salt Persistin   Chloramphenicol Goody's Phenylbutazone   Chloromycetin Haltrain Piroxlcam   Clmetidine heparin Plaquenil   Cllnoril Hyco-pap Ponstel   Clofibrate Hydroxy chloroquine Propoxyphen         Before stopping any of these medications, be sure to consult the physician who ordered them.  Some, such as Coumadin (Warfarin) are ordered to prevent or treat serious conditions such as "deep thrombosis", "pumonary embolisms", and other heart problems.  The amount of time that you may need off of the medication may also vary with the medication and the reason for which  you were taking it.  If you are taking any of these medications, please make sure you notify your pain physician before you undergo any procedures.

## 2015-08-26 NOTE — Progress Notes (Signed)
   Subjective:    Patient ID: Hal MoralesBrandy Leigh Sedgwick, female    DOB: 08/16/1985, 30 y.o.   MRN: [id number]  HPI  The patient is a 30 year old female who returns to pain management for further evaluation and treatment of pain involving the lower back and lower extremity region predominantly the patient states the pain radiates from the lumbar region toward the buttocks right greater than the left at times. The pain is increased with standing walking and climbing stairs. We discussed patient undergoing further evaluation including surgical evaluation at the present time we will proceed with scheduling patient for radiofrequency rhizolysis lumbar facet, medial branch nerves. The patient will continue medications consisting of baclofen and hydrocodone acetaminophen. The patient denies any trauma change in events of daily living the call significant change in symptomatology. The patient's pain is present throughout the day and interferes with ability to obtain restful sleep as well. The patient is understanding and wished to proceed with radiofrequency rhizolysis lumbar facet, medial branch nerves at time return appointment as discussed. All agreed to suggested treatment plan      Review of Systems     Objective:   Physical Exam   There was tenderness of the splenius capitis and occipitalis regions palpation which be produced pain of mild-to-moderate degree with moderate tenderness over the cervical facet cervical paraspinal musculature region as well as mild to moderate tenderness of the thoracic facet thoracic paraspinal musculature region. Palpation of the acromioclavicular and glenohumeral joint regions reproduces minimal discomfort and patient appeared to be with unremarkable drop test and unremarkable Spurling's maneuver. The patient appeared to be with bilaterally equal grip strength with Tinel and Phalen's maneuver reproducing minimal discomfort. Palpation over the thoracic region thoracic facet  region reproduced severe pain with lateral bending rotation extension and palpation of the lumbar facets reproducing severe pain with severe tenderness of the PSIS and PII S region as well as the gluteal and piriformis musculature region with mild tenderness of the greater trochanteric region iliotibial band region. Straight leg raise was tolerates approximately 30 without increased pain with dorsiflexion noted. No definite sensory deficit or dermatomal dystrophy detected. There was negative clonus negative Homans. Nontender with no costovertebral tenderness noted.     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 Clint GuyLindley for evaliation of  BP and general medical  condition  F/U surgical evaluation. 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 May consider PNCV/EMG studies and other studies pending follow-up evaluations  May consider  intraspinal procedures pending response to present treatment and F/U evaluation  Patient to call Pain Management Center should patient have concerns prior to scheduled return appointment

## 2015-08-26 NOTE — Progress Notes (Signed)
Patient here for medication management d/t lower back pain and bilateral hip pain. Safety precautions to be maintained throughout the outpatient stay will include: orient to surroundings, keep bed in low position, maintain call bell within reach at all times, provide assistance with transfer out of bed and ambulation.

## 2015-08-31 LAB — TOXASSURE SELECT 13 (MW), URINE: PDF: 0

## 2015-09-11 ENCOUNTER — Encounter: Payer: Self-pay | Admitting: Pain Medicine

## 2015-09-11 ENCOUNTER — Ambulatory Visit: Attending: Pain Medicine | Admitting: Pain Medicine

## 2015-09-11 VITALS — BP 119/79 | HR 104 | Temp 98.1°F | Resp 16 | Ht 68.0 in | Wt 130.0 lb

## 2015-09-11 DIAGNOSIS — M5481 Occipital neuralgia: Secondary | ICD-10-CM

## 2015-09-11 DIAGNOSIS — M79606 Pain in leg, unspecified: Secondary | ICD-10-CM | POA: Diagnosis present

## 2015-09-11 DIAGNOSIS — M47816 Spondylosis without myelopathy or radiculopathy, lumbar region: Secondary | ICD-10-CM

## 2015-09-11 DIAGNOSIS — Q7649 Other congenital malformations of spine, not associated with scoliosis: Secondary | ICD-10-CM | POA: Diagnosis not present

## 2015-09-11 DIAGNOSIS — S32059A Unspecified fracture of fifth lumbar vertebra, initial encounter for closed fracture: Secondary | ICD-10-CM | POA: Insufficient documentation

## 2015-09-11 DIAGNOSIS — M5136 Other intervertebral disc degeneration, lumbar region: Secondary | ICD-10-CM | POA: Diagnosis not present

## 2015-09-11 DIAGNOSIS — X58XXXA Exposure to other specified factors, initial encounter: Secondary | ICD-10-CM | POA: Diagnosis not present

## 2015-09-11 DIAGNOSIS — M533 Sacrococcygeal disorders, not elsewhere classified: Secondary | ICD-10-CM

## 2015-09-11 DIAGNOSIS — M545 Low back pain: Secondary | ICD-10-CM | POA: Diagnosis present

## 2015-09-11 DIAGNOSIS — M5416 Radiculopathy, lumbar region: Secondary | ICD-10-CM

## 2015-09-11 MED ORDER — SODIUM CHLORIDE 0.9% FLUSH
20.0000 mL | Freq: Once | INTRAVENOUS | Status: AC
  Administered 2015-09-11: 20 mL

## 2015-09-11 MED ORDER — MIDAZOLAM HCL 5 MG/5ML IJ SOLN
5.0000 mg | Freq: Once | INTRAMUSCULAR | Status: AC
  Administered 2015-09-11: 5 mg via INTRAVENOUS
  Filled 2015-09-11: qty 5

## 2015-09-11 MED ORDER — LACTATED RINGERS IV SOLN
1000.0000 mL | INTRAVENOUS | Status: DC
  Administered 2015-09-11: 1000 mL via INTRAVENOUS

## 2015-09-11 MED ORDER — BUPIVACAINE HCL (PF) 0.25 % IJ SOLN
INTRAMUSCULAR | Status: AC
  Filled 2015-09-11: qty 30

## 2015-09-11 MED ORDER — LIDOCAINE HCL (PF) 1 % IJ SOLN
10.0000 mL | Freq: Once | INTRAMUSCULAR | Status: AC
  Administered 2015-09-11: 10 mL via SUBCUTANEOUS
  Filled 2015-09-11: qty 10

## 2015-09-11 MED ORDER — BUPIVACAINE HCL (PF) 0.25 % IJ SOLN
30.0000 mL | Freq: Once | INTRAMUSCULAR | Status: AC
  Administered 2015-09-11: 30 mL

## 2015-09-11 MED ORDER — FENTANYL CITRATE (PF) 100 MCG/2ML IJ SOLN
100.0000 ug | Freq: Once | INTRAMUSCULAR | Status: AC
  Administered 2015-09-11: 100 ug via INTRAVENOUS
  Filled 2015-09-11: qty 2

## 2015-09-11 MED ORDER — ORPHENADRINE CITRATE 30 MG/ML IJ SOLN
60.0000 mg | Freq: Once | INTRAMUSCULAR | Status: AC
  Administered 2015-09-11: 60 mg via INTRAMUSCULAR

## 2015-09-11 NOTE — Progress Notes (Signed)
Safety precautions to be maintained throughout the outpatient stay will include: orient to surroundings, keep bed in low position, maintain call bell within reach at all times, provide assistance with transfer out of bed and ambulation.  

## 2015-09-11 NOTE — Patient Instructions (Addendum)
PLAN   Continue present medications baclofen and  hydrocodone acetaminophen       NO OXYCODONE  F/U PCP Dr. Clint Guy for evaliation of  BP and general medical  condition  F/U surgical evaluation as discussed  F/U neurological evaluation. May consider PNCV/EMG studies and other studies pending follow-up evaluations  May consider radiofrequency rhizolysis or intraspinal procedures pending response to present treatment and F/U evaluation  Patient to call Pain Management Center should patient have concerns prior to scheduled return appointmentPain Management Discharge Instructions  General Discharge Instructions :  If you need to reach your doctor call: Monday-Friday 8:00 am - 4:00 pm at [phone] or toll free [phone].  After clinic hours [phone] to have operator reach doctor.  Bring all of your medication bottles to all your appointments in the pain clinic.  To cancel or reschedule your appointment with Pain Management please remember to call 24 hours in advance to avoid a fee.  Refer to the educational materials which you have been given on: General Risks, I had my Procedure. Discharge Instructions, Post Sedation.  Post Procedure Instructions:  The drugs you were given will stay in your system until tomorrow, so for the next 24 hours you should not drive, make any legal decisions or drink any alcoholic beverages.  You may eat anything you prefer, but it is better to start with liquids then soups and crackers, and gradually work up to solid foods.  Please notify your doctor immediately if you have any unusual bleeding, trouble breathing or pain that is not related to your normal pain.  Depending on the type of procedure that was done, some parts of your body may feel week and/or numb.  This usually clears up by tonight or the next day.  Walk with the use of an assistive device or accompanied by an adult for the 24 hours.  You may use ice on the affected area for the  first 24 hours.  Put ice in a Ziploc bag and cover with a towel and place against area 15 minutes on 15 minutes off.  You may switch to heat after 24 hours.GENERAL RISKS AND COMPLICATIONS  What are the risk, side effects and possible complications? Generally speaking, most procedures are safe.  However, with any procedure there are risks, side effects, and the possibility of complications.  The risks and complications are dependent upon the sites that are lesioned, or the type of nerve block to be performed.  The closer the procedure is to the spine, the more serious the risks are.  Great care is taken when placing the radio frequency needles, block needles or lesioning probes, but sometimes complications can occur. 1. Infection: Any time there is an injection through the skin, there is a risk of infection.  This is why sterile conditions are used for these blocks.  There are four possible types of infection. 1. Localized skin infection. 2. Central Nervous System Infection-This can be in the form of Meningitis, which can be deadly. 3. Epidural Infections-This can be in the form of an epidural abscess, which can cause pressure inside of the spine, causing compression of the spinal cord with subsequent paralysis. This would require an emergency surgery to decompress, and there are no guarantees that the patient would recover from the paralysis. 4. Discitis-This is an infection of the intervertebral discs.  It occurs in about 1% of discography procedures.  It is difficult to treat and it may lead to surgery.  2. Pain: the needles have to go through skin and soft tissues, will cause soreness.       3. Damage to internal structures:  The nerves to be lesioned may be near blood vessels or    other nerves which can be potentially damaged.       4. Bleeding: Bleeding is more common if the patient is taking blood thinners such as  aspirin, Coumadin, Ticiid, Plavix, etc., or if he/she have some genetic  predisposition  such as hemophilia. Bleeding into the spinal canal can cause compression of the spinal  cord with subsequent paralysis.  This would require an emergency surgery to  decompress and there are no guarantees that the patient would recover from the  paralysis.       5. Pneumothorax:  Puncturing of a lung is a possibility, every time a needle is introduced in  the area of the chest or upper back.  Pneumothorax refers to free air around the  collapsed lung(s), inside of the thoracic cavity (chest cavity).  Another two possible  complications related to a similar event would include: Hemothorax and Chylothorax.   These are variations of the Pneumothorax, where instead of air around the collapsed  lung(s), you may have blood or chyle, respectively.       6. Spinal headaches: They may occur with any procedures in the area of the spine.       7. Persistent CSF (Cerebro-Spinal Fluid) leakage: This is a rare problem, but may occur  with prolonged intrathecal or epidural catheters either due to the formation of a fistulous  track or a dural tear.       8. Nerve damage: By working so close to the spinal cord, there is always a possibility of  nerve damage, which could be as serious as a permanent spinal cord injury with  paralysis.       9. Death:  Although rare, severe deadly allergic reactions known as "Anaphylactic  reaction" can occur to any of the medications used.      10. Worsening of the symptoms:  We can always make thing worse.  What are the chances of something like this happening? Chances of any of this occuring are extremely low.  By statistics, you have more of a chance of getting killed in a motor vehicle accident: while driving to the hospital than any of the above occurring .  Nevertheless, you should be aware that they are possibilities.  In general, it is similar to taking a shower.  Everybody knows that you can slip, hit your head and get killed.  Does that mean that you should not  shower again?  Nevertheless always keep in mind that statistics do not mean anything if you happen to be on the wrong side of them.  Even if a procedure has a 1 (one) in a 1,000,000 (million) chance of going wrong, it you happen to be that one..Also, keep in mind that by statistics, you have more of a chance of having something go wrong when taking medications.  Who should not have this procedure? If you are on a blood thinning medication (e.g. Coumadin, Plavix, see list of "Blood Thinners"), or if you have an active infection going on, you should not have the procedure.  If you are taking any blood thinners, please inform your physician.  How should I prepare for this procedure?  Do not eat or drink anything at least six hours prior to the procedure.  Bring a driver  with you .  It cannot be a taxi.  Come accompanied by an adult that can drive you back, and that is strong enough to help you if your legs get weak or numb from the local anesthetic.  Take all of your medicines the morning of the procedure with just enough water to swallow them.  If you have diabetes, make sure that you are scheduled to have your procedure done first thing in the morning, whenever possible.  If you have diabetes, take only half of your insulin dose and notify our nurse that you have done so as soon as you arrive at the clinic.  If you are diabetic, but only take blood sugar pills (oral hypoglycemic), then do not take them on the morning of your procedure.  You may take them after you have had the procedure.  Do not take aspirin or any aspirin-containing medications, at least eleven (11) days prior to the procedure.  They may prolong bleeding.  Wear loose fitting clothing that may be easy to take off and that you would not mind if it got stained with Betadine or blood.  Do not wear any jewelry or perfume  Remove any nail coloring.  It will interfere with some of our monitoring equipment.  NOTE: Remember that  this is not meant to be interpreted as a complete list of all possible complications.  Unforeseen problems may occur.  BLOOD THINNERS The following drugs contain aspirin or other products, which can cause increased bleeding during surgery and should not be taken for 2 weeks prior to and 1 week after surgery.  If you should need take something for relief of minor pain, you may take acetaminophen which is found in Tylenol,m Datril, Anacin-3 and Panadol. It is not blood thinner. The products listed below are.  Do not take any of the products listed below in addition to any listed on your instruction sheet.  A.P.C or A.P.C with Codeine Codeine Phosphate Capsules #3 Ibuprofen Ridaura  ABC compound Congesprin Imuran rimadil  Advil Cope Indocin Robaxisal  Alka-Seltzer Effervescent Pain Reliever and Antacid Coricidin or Coricidin-D  Indomethacin Rufen  Alka-Seltzer plus Cold Medicine Cosprin Ketoprofen S-A-C Tablets  Anacin Analgesic Tablets or Capsules Coumadin Korlgesic Salflex  Anacin Extra Strength Analgesic tablets or capsules CP-2 Tablets Lanoril Salicylate  Anaprox Cuprimine Capsules Levenox Salocol  Anexsia-D Dalteparin Magan Salsalate  Anodynos Darvon compound Magnesium Salicylate Sine-off  Ansaid Dasin Capsules Magsal Sodium Salicylate  Anturane Depen Capsules Marnal Soma  APF Arthritis pain formula Dewitt's Pills Measurin Stanback  Argesic Dia-Gesic Meclofenamic Sulfinpyrazone  Arthritis Bayer Timed Release Aspirin Diclofenac Meclomen Sulindac  Arthritis pain formula Anacin Dicumarol Medipren Supac  Analgesic (Safety coated) Arthralgen Diffunasal Mefanamic Suprofen  Arthritis Strength Bufferin Dihydrocodeine Mepro Compound Suprol  Arthropan liquid Dopirydamole Methcarbomol with Aspirin Synalgos  ASA tablets/Enseals Disalcid Micrainin Tagament  Ascriptin Doan's Midol Talwin  Ascriptin A/D Dolene Mobidin Tanderil  Ascriptin Extra Strength Dolobid Moblgesic Ticlid  Ascriptin with Codeine  Doloprin or Doloprin with Codeine Momentum Tolectin  Asperbuf Duoprin Mono-gesic Trendar  Aspergum Duradyne Motrin or Motrin IB Triminicin  Aspirin plain, buffered or enteric coated Durasal Myochrisine Trigesic  Aspirin Suppositories Easprin Nalfon Trillsate  Aspirin with Codeine Ecotrin Regular or Extra Strength Naprosyn Uracel  Atromid-S Efficin Naproxen Ursinus  Auranofin Capsules Elmiron Neocylate Vanquish  Axotal Emagrin Norgesic Verin  Azathioprine Empirin or Empirin with Codeine Normiflo Vitamin E  Azolid Emprazil Nuprin Voltaren  Bayer Aspirin plain, buffered or children's or timed BC Tablets or powders  Encaprin Orgaran Warfarin Sodium  Buff-a-Comp Enoxaparin Orudis Zorpin  Buff-a-Comp with Codeine Equegesic Os-Cal-Gesic   Buffaprin Excedrin plain, buffered or Extra Strength Oxalid   Bufferin Arthritis Strength Feldene Oxphenbutazone   Bufferin plain or Extra Strength Feldene Capsules Oxycodone with Aspirin   Bufferin with Codeine Fenoprofen Fenoprofen Pabalate or Pabalate-SF   Buffets II Flogesic Panagesic   Buffinol plain or Extra Strength Florinal or Florinal with Codeine Panwarfarin   Buf-Tabs Flurbiprofen Penicillamine   Butalbital Compound Four-way cold tablets Penicillin   Butazolidin Fragmin Pepto-Bismol   Carbenicillin Geminisyn Percodan   Carna Arthritis Reliever Geopen Persantine   Carprofen Gold's salt Persistin   Chloramphenicol Goody's Phenylbutazone   Chloromycetin Haltrain Piroxlcam   Clmetidine heparin Plaquenil   Cllnoril Hyco-pap Ponstel   Clofibrate Hydroxy chloroquine Propoxyphen         Before stopping any of these medications, be sure to consult the physician who ordered them.  Some, such as Coumadin (Warfarin) are ordered to prevent or treat serious conditions such as "deep thrombosis", "pumonary embolisms", and other heart problems.  The amount of time that you may need off of the medication may also vary with the medication and the reason for which  you were taking it.  If you are taking any of these medications, please make sure you notify your pain physician before you undergo any procedures.

## 2015-09-11 NOTE — Progress Notes (Signed)
Subjective:    Patient ID: Kerri Fuentes, female    DOB: [birth year], 30 y.o.   MRN: [id number]  HPI  PROCEDURE PERFORMED: Radiofrequency rhizolysis (lunbar facet medial branch nerve radiofrequency rhizolysis).  The procedure was performed with the COOLIEF technique.  COOLIEF technique was performed using the Synergy cooled radiofrequency probe [id number]-17-150-4, the synergy cooled radiofrequency introducer [id number]-17-150, and the synergy cooled sterile tube kit [id number].   HISTORY: The patient is a 30 y.o. female who returns to the [facility] for further evaluation of severely disabling pain involving the lumbar and lower extremity region.  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 The patient has had significant relief of pain following previous lumbar facet, medial branch nerve, blocks. Radiofrequency rhizolysis of the lumbar facets, medial branch nerves, will be performed at this time an attempt to provide longer lasting relief of the patient's pain, minimize progression of the patient's symptoms, and avoid the need for more involved treatment. The risks, benefits and expectations of the procedure have been discussed and explained to the patient who was with understanding and wished to proceed with interventional treatment in an attempt to decrease severely  disabling pain of the lumbar and lower extremity region.  We will proceed with what is felt to be a medically necessary procedure, radiofrequency rhizolysis (lumbar facet medial branch nerve radiofrequency rhizolysis) using the COOLIEF technique.  DESCRIPTION OF PROCEDURE: COOLIEF Technique Radiofrequency Rhizolysis of Lumbar Facets (Medial Branch Nerves Radiofrequency Rhizolysis)  with IV Versed, IV Fentanyl conscious sedation, EKG, blood pressure, pulse, capnography, and pulse oximetry monitoring.  The procedure was performed with the patient in the prone position under fluoroscopic guidance.  Following Betadine prep of the proposed entry site, a 1.5% lidocaine plain skin wheal of the proposed needle entry site was  prepared in oblique orientation.   Right, L5 Radiofrequency Rhizolysis L5 Facet  (Medial Branch Nerve): Under fluoroscopic guidance, the radiofrequency needle was inserted at the L5 vertebral body level after a local anesthetic skin wheal of 1.5% lidocaine plain and Betadine prep of the proposed needle entry site was performed.  The needle was inserted under fluoroscopic guidance in the area known as Burton's eye or eye of the Scotty dog with needle placed at the superior and lateral border of the targeted area with oblique orientation utilizing the tunnel (gun  barrel approach) technique to the targeted area.  Right Radiofrequency Rhizolysis at L4 and L3  Lumbar Facets (Medial Branch Nerves) The procedure was performed at L4 and L3 exactly as was performed at the L5 and under fluoroscopic guidance utilizing the identical technique as utilized at the L5 vertebral body level.   Needle was then verified on lateral view and following needle placement verification on lateral view, radiofrequency testing was then carried out with motor testing at 2 Hz stimulation without evidence of stimulation of the lower extremity. Following radiofrequency testing for motor testing, each radiofrequency probe was then prepared with 2 mL of preservative-free lidocaine and following 1 minute of anesthetizing each proposed radiofrequency lesioning  Level, the radiofrequency probe was then inserted in the right, L5 vertebral body level needle with radiofrequency lesioning carried out for 2-1/2 minutes with temperature of the tissue being maintained at 80 degrees centigrade for 2-1/2  minutes.  Radiofrequency probe was then  inserted in the right, L4 vertebral body level needle and radiofrequency lesioning was performed at 80 degrees centigrade tissue temperature for 2-1/2 minutes.  Radiofrequency probe was then inserted in the right L3 vertebral body level needle and radiofrequency lesioning was performed at 80 degrees centigrade tissue temperature for 2-1/2 minutes  The patient tolerated the procedure well.   PLAN: 1. Medications: The patient will continue the present medications baclofen and hydrocodone acetaminophen 2. The patient will follow up primary care physician Dr.  Lavena Bullion regarding blood pressure and general medical condition as discussed with the patient.  3. Surgical evaluation as discussed. 4. Neurological evaluation as discussed.  5. The patient has been advised to call the Pain Management Center prior to scheduled return appointment should there be significant change in the patient's condition or should the patient have other concerns regarding condition prior to scheduled return appointment.  The patient is with understanding and is in agreement with suggested treatment plan.     Review of Systems     Objective:   Physical Exam        Assessment & Plan:

## 2015-09-12 ENCOUNTER — Telehealth: Payer: Self-pay

## 2015-09-12 NOTE — Telephone Encounter (Signed)
Post procedure phone call.  Left message.  

## 2015-09-17 ENCOUNTER — Ambulatory Visit: Attending: Pain Medicine | Admitting: Pain Medicine

## 2015-09-17 ENCOUNTER — Encounter: Payer: Self-pay | Admitting: Pain Medicine

## 2015-09-17 VITALS — BP 122/97 | HR 100 | Temp 98.7°F | Resp 16 | Ht 68.0 in | Wt 130.0 lb

## 2015-09-17 DIAGNOSIS — M5481 Occipital neuralgia: Secondary | ICD-10-CM | POA: Diagnosis not present

## 2015-09-17 DIAGNOSIS — M47816 Spondylosis without myelopathy or radiculopathy, lumbar region: Secondary | ICD-10-CM

## 2015-09-17 DIAGNOSIS — M545 Low back pain: Secondary | ICD-10-CM | POA: Diagnosis present

## 2015-09-17 DIAGNOSIS — G43909 Migraine, unspecified, not intractable, without status migrainosus: Secondary | ICD-10-CM | POA: Insufficient documentation

## 2015-09-17 DIAGNOSIS — Q7649 Other congenital malformations of spine, not associated with scoliosis: Secondary | ICD-10-CM | POA: Insufficient documentation

## 2015-09-17 DIAGNOSIS — M5136 Other intervertebral disc degeneration, lumbar region: Secondary | ICD-10-CM | POA: Diagnosis not present

## 2015-09-17 DIAGNOSIS — M533 Sacrococcygeal disorders, not elsewhere classified: Secondary | ICD-10-CM | POA: Insufficient documentation

## 2015-09-17 DIAGNOSIS — M79606 Pain in leg, unspecified: Secondary | ICD-10-CM | POA: Diagnosis present

## 2015-09-17 DIAGNOSIS — M5416 Radiculopathy, lumbar region: Secondary | ICD-10-CM

## 2015-09-17 MED ORDER — BACLOFEN 20 MG PO TABS: ORAL_TABLET | ORAL | Status: DC

## 2015-09-17 MED ORDER — HYDROCODONE-ACETAMINOPHEN 10-325 MG PO TABS: ORAL_TABLET | ORAL | Status: DC

## 2015-09-17 NOTE — Progress Notes (Signed)
   Subjective:    Patient ID: Hal MoralesBrandy Leigh Belsito, female    DOB: 02/10/1986, 30 y.o.   MRN: [id number]  HPI  The patient is a 30 year old female who returns to pain management for further evaluation and treatment of pain involving the lower back and lower extremity region. The patient is with prior interventional treatment consisting of radiofrequency rhizolysis lumbar facet, medial branch nerve performed on the right side. The patient has improvement of her pain and states that she is able to perform twisting turning maneuvers without experiencing the severe pain she was experienced. We will continue present medications at this time and will consider patient for modification of treatment should they be significant change in condition. The patient was understanding and agreement suggested treatment plan.    Review of Systems     Objective:   Physical Exam  Palpation of the splenius capitis and occipitalis regions reproduced pain of mild degree with mild tenderness of the cervical facet cervical paraspinal musculature regions. Palpation over the region of the thoracic facet thoracic paraspinal musculature region was attends to palpation of the lower thoracic region a moderate degree. Palpation of the acromioclavicular and glenohumeral joint regions reproduce moderate discomfort and patient appeared to be with unremarkable Spurling's maneuver. The patient was with bilaterally equal grip strength without significant increase of pain with Tinel and Phalen's maneuver. Palpation over the lumbar region lumbar facet region was with moderate tenderness to palpation with lateral bending rotation extension and palpation of the lumbar facets reproducing moderate discomfort on the right compared to the left. Straight leg raising was tolerates approximately 30 without increased pain with dorsiflexion noted. There was negative clonus negative Homans. Abdomen nontender with no costovertebral tenderness noted  palpation over the PSIS and PII S region was attends to palpation of mild to moderate degree on the right compared to the left on reevaluation patient.       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 Clint GuyLindley for evaliation of  BP and general medical  condition  F/U surgical evaluation. Ask the secretary and nurses the date of your orthopedic evaluation of lower back and lower extremity pain  F/U neurological evaluation. May consider PNCV/EMG studies and other studies pending follow-up evaluations  May consider radiofrequency rhizolysis or intraspinal procedures pending response to present treatment and F/U evaluation  Patient to call Pain Management Center should patient have concerns prior to scheduled return appointment

## 2015-09-17 NOTE — Progress Notes (Signed)
Safety precautions to be maintained throughout the outpatient stay will include: orient to surroundings, keep bed in low position, maintain call bell within reach at all times, provide assistance with transfer out of bed and ambulation.  

## 2015-09-17 NOTE — Patient Instructions (Signed)
PLAN    Continue present medications baclofen and  hydrocodone acetaminophen       NO OXYCODONE  F/U PCP Dr. Clint GuyLindley for evaliation of  BP and general medical  condition  F/U surgical evaluation. Ask the secretary and nurses the date of your orthopedic evaluation of lower back and lower extremity pain  F/U neurological evaluation. May consider PNCV/EMG studies and other studies pending follow-up evaluations  May consider radiofrequency rhizolysis or intraspinal procedures pending response to present treatment and F/U evaluation  Patient to call Pain Management Center should patient have concerns prior to scheduled return appointment

## 2015-09-25 ENCOUNTER — Ambulatory Visit: Payer: Self-pay | Admitting: Pain Medicine

## 2015-09-27 ENCOUNTER — Ambulatory Visit: Payer: Self-pay | Admitting: Pain Medicine

## 2015-09-30 ENCOUNTER — Telehealth: Payer: Self-pay
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
## 2015-09-30 NOTE — Telephone Encounter (Signed)
Nurses We will be unable to refill medications at this time. Please schedule patient for 10/14/2015 and discuss with me if necessary. Thank you
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
## 2015-09-30 NOTE — Telephone Encounter (Signed)
Dr. Crisp, please advise. 
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
## 2015-10-01 NOTE — Telephone Encounter (Signed)
Patient notified that prescriptions will not be replaced.Patient agrees to come in on 10-14-15.

## 2015-10-14 ENCOUNTER — Ambulatory Visit: Attending: Pain Medicine | Admitting: Pain Medicine

## 2015-10-14 ENCOUNTER — Encounter: Payer: Self-pay | Admitting: Pain Medicine

## 2015-10-14 VITALS — BP 123/66 | HR 96 | Temp 98.3°F | Resp 16 | Ht 68.0 in | Wt 137.0 lb

## 2015-10-14 DIAGNOSIS — M533 Sacrococcygeal disorders, not elsewhere classified: Secondary | ICD-10-CM | POA: Diagnosis not present

## 2015-10-14 DIAGNOSIS — M940 Chondrocostal junction syndrome [Tietze]: Secondary | ICD-10-CM | POA: Insufficient documentation

## 2015-10-14 DIAGNOSIS — M5136 Other intervertebral disc degeneration, lumbar region: Secondary | ICD-10-CM | POA: Diagnosis not present

## 2015-10-14 DIAGNOSIS — M5481 Occipital neuralgia: Secondary | ICD-10-CM | POA: Diagnosis not present

## 2015-10-14 DIAGNOSIS — Q7649 Other congenital malformations of spine, not associated with scoliosis: Secondary | ICD-10-CM | POA: Insufficient documentation

## 2015-10-14 DIAGNOSIS — M545 Low back pain: Secondary | ICD-10-CM | POA: Diagnosis present

## 2015-10-14 DIAGNOSIS — M5416 Radiculopathy, lumbar region: Secondary | ICD-10-CM

## 2015-10-14 DIAGNOSIS — G43909 Migraine, unspecified, not intractable, without status migrainosus: Secondary | ICD-10-CM | POA: Insufficient documentation

## 2015-10-14 DIAGNOSIS — M47816 Spondylosis without myelopathy or radiculopathy, lumbar region: Secondary | ICD-10-CM

## 2015-10-14 DIAGNOSIS — G588 Other specified mononeuropathies: Secondary | ICD-10-CM | POA: Insufficient documentation

## 2015-10-14 MED ORDER — BACLOFEN 20 MG PO TABS: ORAL_TABLET | ORAL | 0 refills | Status: DC

## 2015-10-14 MED ORDER — HYDROCODONE-ACETAMINOPHEN 10-325 MG PO TABS: ORAL_TABLET | ORAL | 0 refills | Status: DC

## 2015-10-14 NOTE — Progress Notes (Signed)
    The patient is a 30 year old female who returns to pain management for further evaluation and treatment of pain involving the lumbar lower extremity region. The patient states that the lower back lower extremity pain has improved significantly following radiofrequency rhizolysis lumbar facet, medial branch nerves. The patient admits to some pain involving the midportion of the back along the border of the ribs on the right side. Palpation of this region reproduced moderate to moderately severe discomfort. The patient denies any trauma change in events of daily living the call significant change in symptomatology. 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 We will proceed with intercostal nerve blocks to be performed at time return appointment in attempt to decrease severity of symptoms, minimize progression of symptoms, and avoid the need for more involved treatment. All agreed to suggested treatment plan. The patient will continue present medications consisting of and hydrocodone acetaminophen as prescribed. All agreed to suggested treatment plan.     Physical examination  There was tenderness of the splenius capitis and occipitalis region of mild degree with mild tenderness of the cervical facet cervical paraspinal musculature region. Palpation of the acromioclavicular and glenohumeral joint regions reproduce mild discomfort. The patient appeared to be with bilaterally equal grip strength and Tinel and Phalen's maneuver were without increase of pain of significant degree. Palpation over the region of the thoracic region was with tenderness to palpation of moderate to moderately severe degree with severe tenderness to palpation along the costal margin on the right and the mid thoracic region on the right with palpation over the ribs reproducing severe discomfort on the right thoracic region. There was  no definite crepitus of the thoracic region noted. Palpation of the lumbar region was attends to palpation of mild to moderate degree with lateral bending rotation extension and palpation of the lumbar facets reproducing mild to moderate discomfort. Straight leg raise was tolerates approximately 30 without increased pain with dorsiflexion noted. There was no definite sensory deficit or dermatomal distribution detected. EHL strength appeared to be slightly decreased. Palpation of the PSIS and PII S regions reproduce moderate discomfort with mild to moderate tenderness of the greater trochanteric region iliotibial band region. No definite sensory deficit or dermatomal distribution was detected. There was negative clonus negative Homans. 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 Clint Guy for evaliation of  BP and general medical  condition  F/U surgical evaluation. Ask the secretary and nurses the date of your orthopedic evaluation of lower back and lower extremity pain  F/U neurological evaluation. May consider PNCV/EMG studies and other studies pending follow-up evaluations  May consider radiofrequency rhizolysis or intraspinal procedures pending response to present treatment and F/U evaluation  Patient to call [facility] should patient have concerns prior to scheduled return appointment

## 2015-10-14 NOTE — Patient Instructions (Addendum)
PLAN    Continue present medications baclofen and  hydrocodone acetaminophen       NO OXYCODONE  Intercostal nerve blocks to be performed at return appointment  F/U PCP Dr. Clint Guy for evaliation of  BP and general medical  condition  F/U surgical evaluation. Ask the secretary and nurses the date of your orthopedic evaluation of lower back and lower extremity pain  F/U neurological evaluation. May consider PNCV/EMG studies and other studies pending follow-up evaluations  May consider radiofrequency rhizolysis or intraspinal procedures pending response to present treatment and F/U evaluation  Patient to call [facility] should patient have concerns prior to scheduled return appointmentIntercostal Nerve Block Patient Information  Description: The twelve intercostal nerves arise from the first thru twelfth thoracic nerve roots.  The nerve begins at the spine and wraps around the body, lying in a groove underneath each rib.  Each intercostal nerve innervates a specific strip of skin and body walk of the abdomen and chest.  Therefore, injuries of the chest wall or abdominal wall result in pain that is transmitted back to the brian via the intercostal nerves.  Examples of such injuries include rib fractures and incisions for lung and gall bladder surgery.  Occasionally, pain may persist long after an injury or surgical incision secondary to inflammation and irritation of the intercostal nerve.  The longstanding pain is known as intercostal neuralgia.  An intercostal nerve block is preformed to eliminate pain either temporarily or permanently.  A small needle is placed below the rib and local anesthetic (like Novocaine) and possibly steroid is injected.  Usually 2-4 intercostal nerves are blocked at a time depending on the problem.  The patient will experience a slight "pin-prick" sensation for each injection.  Shortly thereafter, the strip of skin that is innervated by the blocked intercostal  nerve will feel numb.  Persistent pain that is only temporarily relieved with local anesthetic may require a more permanent block. This procedure is called Cryoneurolysis and entails placing a small probe beneath the rib to freeze the nerve.  Conditions that may be treated by intercostal nerve blocks:   Rib fractures  Longstanding pain from surgery of the chest or abdomen (intercostal neuralgia)  Pain from chest tubes  Pain from trauma to the chest  Preparation for the injections:  1. Do not eat any solid food or dairy products within 8 hours of your appointment. 2. You may drink clear liquids up to 3 hours before appointment.  Clear liquids include water, black coffee, juice or soda.  No milk or cream please. 3. You may take your regular medication, including pain medications, with a sip of water before your appointment.  Diabetics should hold regular insulin (if take separately) and take 1/2 normal NPH dose the morning of the procedure.   Carry some sugar containing items with you to your appointment. 4. A driver must accompany you and be prepared to drive you home after your procedure. 5. Bring all your current medications with you. 6. An IV may be inserted and sedation may be given at the discretion of the physician. 7. A blood pressure cuff, EKG and other monitors will often be applied during the procedure.  Some patients may need to have extra oxygen administered for a short period. 8. You will be asked to provide medical information, including your allergies, prior to the procedure.  We must know immediately if you are taking blood thinners (like Coumadin/Warfarin) or if you are allergic to IV iodine contrast (dye).  We must know if you could possible be pregnant.  Possible side-effects:   Bleeding from needle site  Infection (rare)  Nerve injury (rare)  Numbness & tingling of skin  Collapsed lung requiring chest tube (rare)  Local anesthetic toxicity  (rare)  Light-headedness (temporary)  Pain at injection site (several days)  Decreased blood pressure (temporary)  Shortness of breath  Jittery/shaking sensation (temporary)  Call if you experience:   Difficulty breathing or hives (go directly to the emergency room)  Redness, inflammation or drainage at the injection site  Severe pain at the site of the injection  Any new symptoms which are concerning   Please note:  Your pain may subside immediately but may return several hours after the injection.  Often, more than one injection is required to reduce the pain. Also, if several temporary blocks with local anesthetic are ineffective, a more permanent block with cryolysis may be necessary.  This will be discussed with you should this be the case.  If you have any questions, please call [phone] [facility]    GENERAL RISKS AND COMPLICATIONS  What are the risk, side effects and possible complications? Generally speaking, most procedures are safe.  However, with any procedure there are risks, side effects, and the possibility of complications.  The risks and complications are dependent upon the sites that are lesioned, or the type of nerve block to be performed.  The closer the procedure is to the spine, the more serious the risks are.  Great care is taken when placing the radio frequency needles, block needles or lesioning probes, but sometimes complications can occur. 1. Infection: Any time there is an injection through the skin, there is a risk of infection.  This is why sterile conditions are used for these blocks.  There are four possible types of infection. 1. Localized skin infection. 2. Central Nervous System Infection-This can be in the form of Meningitis, which can be deadly. 3. Epidural Infections-This can be in the form of an epidural abscess, which can cause pressure inside of the spine, causing compression of the spinal cord  with subsequent paralysis. This would require an emergency surgery to decompress, and there are no guarantees that the patient would recover from the paralysis. 4. Discitis-This is an infection of the intervertebral discs.  It occurs in about 1% of discography procedures.  It is difficult to treat and it may lead to surgery.        2. Pain: the needles have to go through skin and soft tissues, will cause soreness.       3. Damage to internal structures:  The nerves to be lesioned may be near blood vessels or    other nerves which can be potentially damaged.       4. Bleeding: Bleeding is more common if the patient is taking blood thinners such as  aspirin, Coumadin, Ticiid, Plavix, etc., or if he/she have some genetic predisposition  such as hemophilia. Bleeding into the spinal canal can cause compression of the spinal  cord with subsequent paralysis.  This would require an emergency surgery to  decompress and there are no guarantees that the patient would recover from the  paralysis.       5. Pneumothorax:  Puncturing of a lung is a possibility, every time a needle is introduced in  the area of the chest or upper back.  Pneumothorax refers to free air around the  collapsed lung(s), inside of the thoracic cavity (  chest cavity).  Another two possible  complications related to a similar event would include: Hemothorax and Chylothorax.   These are variations of the Pneumothorax, where instead of air around the collapsed  lung(s), you may have blood or chyle, respectively.       6. Spinal headaches: They may occur with any procedures in the area of the spine.       7. Persistent CSF (Cerebro-Spinal Fluid) leakage: This is a rare problem, but may occur  with prolonged intrathecal or epidural catheters either due to the formation of a fistulous  track or a dural tear.       8. Nerve damage: By working so close to the spinal cord, there is always a possibility of  nerve damage, which could be as serious as a  permanent spinal cord injury with  paralysis.       9. Death:  Although rare, severe deadly allergic reactions known as "Anaphylactic  reaction" can occur to any of the medications used.      10. Worsening of the symptoms:  We can always make thing worse.  What are the chances of something like this happening? Chances of any of this occuring are extremely low.  By statistics, you have more of a chance of getting killed in a motor vehicle accident: while driving to the hospital than any of the above occurring .  Nevertheless, you should be aware that they are possibilities.  In general, it is similar to taking a shower.  Everybody knows that you can slip, hit your head and get killed.  Does that mean that you should not shower again?  Nevertheless always keep in mind that statistics do not mean anything if you happen to be on the wrong side of them.  Even if a procedure has a 1 (one) in a 1,000,000 (million) chance of going wrong, it you happen to be that one..Also, keep in mind that by statistics, you have more of a chance of having something go wrong when taking medications.  Who should not have this procedure? If you are on a blood thinning medication (e.g. Coumadin, Plavix, see list of "Blood Thinners"), or if you have an active infection going on, you should not have the procedure.  If you are taking any blood thinners, please inform your physician.  How should I prepare for this procedure?  Do not eat or drink anything at least six hours prior to the procedure.  Bring a driver with you .  It cannot be a taxi.  Come accompanied by an adult that can drive you back, and that is strong enough to help you if your legs get weak or numb from the local anesthetic.  Take all of your medicines the morning of the procedure with just enough water to swallow them.  If you have diabetes, make sure that you are scheduled to have your procedure done first thing in the morning, whenever possible.  If you  have diabetes, take only half of your insulin dose and notify our nurse that you have done so as soon as you arrive at the clinic.  If you are diabetic, but only take blood sugar pills (oral hypoglycemic), then do not take them on the morning of your procedure.  You may take them after you have had the procedure.  Do not take aspirin or any aspirin-containing medications, at least eleven (11) days prior to the procedure.  They may prolong bleeding.  Wear loose fitting clothing that may  be easy to take off and that you would not mind if it got stained with Betadine or blood.  Do not wear any jewelry or perfume  Remove any nail coloring.  It will interfere with some of our monitoring equipment.  NOTE: Remember that this is not meant to be interpreted as a complete list of all possible complications.  Unforeseen problems may occur.  BLOOD THINNERS The following drugs contain aspirin or other products, which can cause increased bleeding during surgery and should not be taken for 2 weeks prior to and 1 week after surgery.  If you should need take something for relief of minor pain, you may take acetaminophen which is found in Tylenol,m Datril, Anacin-3 and Panadol. It is not blood thinner. The products listed below are.  Do not take any of the products listed below in addition to any listed on your instruction sheet.  A.P.C or A.P.C with Codeine Codeine Phosphate Capsules #3 Ibuprofen Ridaura  ABC compound Congesprin Imuran rimadil  Advil Cope Indocin Robaxisal  Alka-Seltzer Effervescent Pain Reliever and Antacid Coricidin or Coricidin-D  Indomethacin Rufen  Alka-Seltzer plus Cold Medicine Cosprin Ketoprofen S-A-C Tablets  Anacin Analgesic Tablets or Capsules Coumadin Korlgesic Salflex  Anacin Extra Strength Analgesic tablets or capsules CP-2 Tablets Lanoril Salicylate  Anaprox Cuprimine Capsules Levenox Salocol  Anexsia-D Dalteparin Magan Salsalate  Anodynos Darvon compound Magnesium  Salicylate Sine-off  Ansaid Dasin Capsules Magsal Sodium Salicylate  Anturane Depen Capsules Marnal Soma  APF Arthritis pain formula Dewitt's Pills Measurin Stanback  Argesic Dia-Gesic Meclofenamic Sulfinpyrazone  Arthritis Bayer Timed Release Aspirin Diclofenac Meclomen Sulindac  Arthritis pain formula Anacin Dicumarol Medipren Supac  Analgesic (Safety coated) Arthralgen Diffunasal Mefanamic Suprofen  Arthritis Strength Bufferin Dihydrocodeine Mepro Compound Suprol  Arthropan liquid Dopirydamole Methcarbomol with Aspirin Synalgos  ASA tablets/Enseals Disalcid Micrainin Tagament  Ascriptin Doan's Midol Talwin  Ascriptin A/D Dolene Mobidin Tanderil  Ascriptin Extra Strength Dolobid Moblgesic Ticlid  Ascriptin with Codeine Doloprin or Doloprin with Codeine Momentum Tolectin  Asperbuf Duoprin Mono-gesic Trendar  Aspergum Duradyne Motrin or Motrin IB Triminicin  Aspirin plain, buffered or enteric coated Durasal Myochrisine Trigesic  Aspirin Suppositories Easprin Nalfon Trillsate  Aspirin with Codeine Ecotrin Regular or Extra Strength Naprosyn Uracel  Atromid-S Efficin Naproxen Ursinus  Auranofin Capsules Elmiron Neocylate Vanquish  Axotal Emagrin Norgesic Verin  Azathioprine Empirin or Empirin with Codeine Normiflo Vitamin E  Azolid Emprazil Nuprin Voltaren  Bayer Aspirin plain, buffered or children's or timed BC Tablets or powders Encaprin Orgaran Warfarin Sodium  Buff-a-Comp Enoxaparin Orudis Zorpin  Buff-a-Comp with Codeine Equegesic Os-Cal-Gesic   Buffaprin Excedrin plain, buffered or Extra Strength Oxalid   Bufferin Arthritis Strength Feldene Oxphenbutazone   Bufferin plain or Extra Strength Feldene Capsules Oxycodone with Aspirin   Bufferin with Codeine Fenoprofen Fenoprofen Pabalate or Pabalate-SF   Buffets II Flogesic Panagesic   Buffinol plain or Extra Strength Florinal or Florinal with Codeine Panwarfarin   Buf-Tabs Flurbiprofen Penicillamine   Butalbital Compound Four-way  cold tablets Penicillin   Butazolidin Fragmin Pepto-Bismol   Carbenicillin Geminisyn Percodan   Carna Arthritis Reliever Geopen Persantine   Carprofen Gold's salt Persistin   Chloramphenicol Goody's Phenylbutazone   Chloromycetin Haltrain Piroxlcam   Clmetidine heparin Plaquenil   Cllnoril Hyco-pap Ponstel   Clofibrate Hydroxy chloroquine Propoxyphen         Before stopping any of these medications, be sure to consult the physician who ordered them.  Some, such as Coumadin (Warfarin) are ordered to prevent or treat serious conditions such as "  deep thrombosis", "pumonary embolisms", and other heart problems.  The amount of time that you may need off of the medication may also vary with the medication and the reason for which you were taking it.  If you are taking any of these medications, please make sure you notify your pain physician before you undergo any procedures.

## 2015-10-16 ENCOUNTER — Ambulatory Visit: Payer: Self-pay | Admitting: Pain Medicine

## 2015-10-17 ENCOUNTER — Ambulatory Visit: Payer: Self-pay | Admitting: Pain Medicine

## 2015-10-17 ENCOUNTER — Telehealth: Payer: Self-pay

## 2015-10-17 NOTE — Telephone Encounter (Signed)
Voicemail left with patient re; going on  A trip and swollen feet.  Explained to patient that my first thought is riding in a car and perhaps not drinking enough fluid can cause feet to swell.  Encouraged to drink plenty of water and elevate feet if possible.  If further questions please call us back.

## 2015-10-17 NOTE — Telephone Encounter (Signed)
Pt says she drove out of the state of [place] and her feet have been swollen. Pt wants to know what she  Can do

## 2015-10-30 ENCOUNTER — Telehealth: Payer: Self-pay | Admitting: Pain Medicine

## 2015-10-30 ENCOUNTER — Ambulatory Visit: Payer: Self-pay | Admitting: Pain Medicine

## 2015-10-30 NOTE — Telephone Encounter (Signed)
Called patient about missing appt today for ICNB, she states her car broke down on the way here and had to be towed.

## 2015-10-30 NOTE — Telephone Encounter (Signed)
Thank you very much 

## 2015-11-07 ENCOUNTER — Ambulatory Visit: Attending: Pain Medicine | Admitting: Pain Medicine

## 2015-11-07 ENCOUNTER — Other Ambulatory Visit: Payer: Self-pay

## 2015-11-07 ENCOUNTER — Encounter: Payer: Self-pay | Admitting: Pain Medicine

## 2015-11-07 VITALS — BP 132/77 | HR 95 | Temp 98.3°F | Resp 18 | Ht 68.0 in | Wt 140.0 lb

## 2015-11-07 DIAGNOSIS — M940 Chondrocostal junction syndrome [Tietze]: Secondary | ICD-10-CM

## 2015-11-07 DIAGNOSIS — M5481 Occipital neuralgia: Secondary | ICD-10-CM | POA: Diagnosis not present

## 2015-11-07 DIAGNOSIS — M5416 Radiculopathy, lumbar region: Secondary | ICD-10-CM

## 2015-11-07 DIAGNOSIS — M5136 Other intervertebral disc degeneration, lumbar region: Secondary | ICD-10-CM | POA: Diagnosis not present

## 2015-11-07 DIAGNOSIS — G43909 Migraine, unspecified, not intractable, without status migrainosus: Secondary | ICD-10-CM | POA: Diagnosis not present

## 2015-11-07 DIAGNOSIS — M533 Sacrococcygeal disorders, not elsewhere classified: Secondary | ICD-10-CM | POA: Diagnosis not present

## 2015-11-07 DIAGNOSIS — Q7649 Other congenital malformations of spine, not associated with scoliosis: Secondary | ICD-10-CM | POA: Insufficient documentation

## 2015-11-07 DIAGNOSIS — M542 Cervicalgia: Secondary | ICD-10-CM | POA: Diagnosis present

## 2015-11-07 DIAGNOSIS — M62830 Muscle spasm of back: Secondary | ICD-10-CM | POA: Diagnosis not present

## 2015-11-07 DIAGNOSIS — M5134 Other intervertebral disc degeneration, thoracic region: Secondary | ICD-10-CM

## 2015-11-07 DIAGNOSIS — M546 Pain in thoracic spine: Secondary | ICD-10-CM | POA: Diagnosis present

## 2015-11-07 DIAGNOSIS — M47816 Spondylosis without myelopathy or radiculopathy, lumbar region: Secondary | ICD-10-CM

## 2015-11-07 DIAGNOSIS — G588 Other specified mononeuropathies: Secondary | ICD-10-CM

## 2015-11-07 MED ORDER — HYDROCODONE-ACETAMINOPHEN 10-325 MG PO TABS: ORAL_TABLET | ORAL | 0 refills | Status: AC

## 2015-11-07 MED ORDER — BACLOFEN 20 MG PO TABS: ORAL_TABLET | ORAL | 0 refills | Status: DC

## 2015-11-07 NOTE — Progress Notes (Signed)
Safety precautions to be maintained throughout the outpatient stay will include: orient to surroundings, keep bed in low position, maintain call bell within reach at all times, provide assistance with transfer out of bed and ambulation.  

## 2015-11-07 NOTE — Progress Notes (Signed)
      The patient is a 30 year old female who returns to pain management for further evaluation and treatment of pain involving the region of the neck entire back upper and lower extremity regions. Patient has had return of pain involving the mid back region with moderate muscle spasm of the mid back region interfere with activities of daily living to significant degree. The patient is with significant improvement of pain of the lumbar lower extremity region following radiofrequency rhizolysis lumbar facet, medial branch nerves. At the present time patient states that she is with significant pain despite the use of baclofen and hydrocodone acetaminophen with the mid back pain be in the area of pain which is associated with significant muscle spasms. We discussed patient's condition and we will consider patient for interventional treatment consisting of intercostal nerve blocks to be performed at time return appointment in attempt to decrease severity of patient's symptoms and minimize severity of symptoms as well as retard progression of patient's condition. The patient was with understanding and in agreement suggested treatment plan.     Physical examination  There was tenderness of the splenius capitis and occipitalis region palpation which reproduces pain of mild degree with mild tenderness of the cervical and thoracic facet region with no crepitus of the thoracic region noted. Palpation of the acromioclavicular and glenohumeral joint regions reproduces minimal discomfort and patient was with unremarkable Spurling's maneuver and was able to perform drop test without significant difficulty. Palpation over the region of the thoracic region was with moderate muscle spasms. Palpation of these regions reproduced significant pain. There was tenderness over the lumbar paraspinal must reason lumbar facet region of mild to moderate degree with lateral bending rotation extension and palpation of the lumbar  facets reproducing mild to moderate discomfort. There was tenderness of the PSIS and PII S region of minimal degree with minimal tenderness to palpation of the greater trochanteric region iliotibial band region. Straight leg raise was tolerated to 30 without increased pain with dorsiflexion noted. DTRs trace at the knees with negative clonus negative Homans without sensory deficit of dermatomal distribution detected. Abdomen nontender with no costovertebral tenderness noted.    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 Clint GuyLindley for evaliation of  BP and general medical  condition  F/U surgical evaluation. Ask the secretary and nurses the date of your orthopedic evaluation of lower back and lower extremity pain  F/U neurological evaluation. May consider PNCV/EMG studies and other studies pending follow-up evaluations  May consider radiofrequency rhizolysis or intraspinal procedures pending response to present treatment and F/U evaluation  Patient to call Pain Management Center should patient have concerns prior to scheduled return appointment

## 2015-11-07 NOTE — Patient Instructions (Addendum)
PLAN    Continue present medications baclofen and  hydrocodone acetaminophen       NO OXYCODONE  Intercostal nerve blocks to be performed at return appointment  F/U PCP Dr. Clint GuyLindley for evaliation of  BP and general medical  condition  F/U surgical evaluation. Ask the secretary and nurses the date of your orthopedic evaluation of lower back and lower extremity pain  F/U neurological evaluation. May consider PNCV/EMG studies and other studies pending follow-up evaluations  May consider radiofrequency rhizolysis or intraspinal procedures pending response to present treatment and F/U evaluation  Patient to call [facility] Management Center should patient have concerns prior to scheduled return appointmentiIntercostal Nerve Block Patient Information  Description: The twelve intercostal nerves arise from the first thru twelfth thoracic nerve roots.  The nerve begins at the spine and wraps around the body, lying in a groove underneath each rib.  Each intercostal nerve innervates a specific strip of skin and body walk of the abdomen and chest.  Therefore, injuries of the chest wall or abdominal wall result in pain that is transmitted back to the brian via the intercostal nerves.  Examples of such injuries include rib fractures and incisions for lung and gall bladder surgery.  Occasionally, pain may persist long after an injury or surgical incision secondary to inflammation and irritation of the intercostal nerve.  The longstanding pain is known as intercostal neuralgia.  An intercostal nerve block is preformed to eliminate pain either temporarily or permanently.  A small needle is placed below the rib and local anesthetic (like Novocaine) and possibly steroid is injected.  Usually 2-4 intercostal nerves are blocked at a time depending on the problem.  The patient will experience a slight "pin-prick" sensation for each injection.  Shortly thereafter, the strip of skin that is innervated by the blocked  intercostal nerve will feel numb.  Persistent pain that is only temporarily relieved with local anesthetic may require a more permanent block. This procedure is called Cryoneurolysis and entails placing a small probe beneath the rib to freeze the nerve.  Conditions that may be treated by intercostal nerve blocks:   Rib fractures  Longstanding pain from surgery of the chest or abdomen (intercostal neuralgia)  Pain from chest tubes  Pain from trauma to the chest  Preparation for the injections:  1. Do not eat any solid food or dairy products within 8 hours of your appointment. 2. You may drink clear liquids up to 3 hours before appointment.  Clear liquids include water, black coffee, juice or soda.  No milk or cream please. 3. You may take your regular medication, including pain medications, with a sip of water before your appointment.  Diabetics should hold regular insulin (if take separately) and take 1/2 normal NPH dose the morning of the procedure.   Carry some sugar containing items with you to your appointment. 4. A driver must accompany you and be prepared to drive you home after your procedure. 5. Bring all your current medications with you. 6. An IV may be inserted and sedation may be given at the discretion of the physician. 7. A blood pressure cuff, EKG and other monitors will often be applied during the procedure.  Some patients may need to have extra oxygen administered for a short period. 8. You will be asked to provide medical information, including your allergies, prior to the procedure.  We must know immediately if you are taking blood thinners (like Coumadin/Warfarin) or if you are allergic to IV iodine contrast (dye).  We must know if you could possible be pregnant.  Possible side-effects:   Bleeding from needle site  Infection (rare)  Nerve injury (rare)  Numbness & tingling of skin  Collapsed lung requiring chest tube (rare)  Local anesthetic toxicity  (rare)  Light-headedness (temporary)  Pain at injection site (several days)  Decreased blood pressure (temporary)  Shortness of breath  Jittery/shaking sensation (temporary)  Call if you experience:   Difficulty breathing or hives (go directly to the emergency room)  Redness, inflammation or drainage at the injection site  Severe pain at the site of the injection  Any new symptoms which are concerning   Please note:  Your pain may subside immediately but may return several hours after the injection.  Often, more than one injection is required to reduce the pain. Also, if several temporary blocks with local anesthetic are ineffective, a more permanent block with cryolysis may be necessary.  This will be discussed with you should this be the case.  If you have any questions, please call [phone] [facility]   Hydrocodone script given to patient.

## 2015-11-07 NOTE — Telephone Encounter (Signed)
Pt wants a nurse or Dr Metta Clinesrisp to call pharmacy so that she can fill her meds today.

## 2015-11-07 NOTE — Telephone Encounter (Signed)
Pharmacy called and last fill date was on 10/14/2015. Therefore next refill will be on 11/13/15. Pharmacy states they will note that on script and inform patient when she picks up.

## 2015-11-12 ENCOUNTER — Ambulatory Visit: Payer: Self-pay | Admitting: Pain Medicine

## 2015-11-13 ENCOUNTER — Encounter: Payer: Self-pay | Admitting: Pain Medicine

## 2015-11-13 ENCOUNTER — Ambulatory Visit: Attending: Pain Medicine | Admitting: Pain Medicine

## 2015-11-13 VITALS — BP 103/70 | HR 78 | Temp 98.3°F | Resp 16 | Ht 68.0 in | Wt 145.0 lb

## 2015-11-13 DIAGNOSIS — M5136 Other intervertebral disc degeneration, lumbar region: Secondary | ICD-10-CM

## 2015-11-13 DIAGNOSIS — M5134 Other intervertebral disc degeneration, thoracic region: Secondary | ICD-10-CM

## 2015-11-13 DIAGNOSIS — M940 Chondrocostal junction syndrome [Tietze]: Secondary | ICD-10-CM

## 2015-11-13 DIAGNOSIS — M51369 Other intervertebral disc degeneration, lumbar region without mention of lumbar back pain or lower extremity pain: Secondary | ICD-10-CM

## 2015-11-13 DIAGNOSIS — G588 Other specified mononeuropathies: Secondary | ICD-10-CM

## 2015-11-13 DIAGNOSIS — M546 Pain in thoracic spine: Secondary | ICD-10-CM | POA: Insufficient documentation

## 2015-11-13 DIAGNOSIS — M533 Sacrococcygeal disorders, not elsewhere classified: Secondary | ICD-10-CM

## 2015-11-13 DIAGNOSIS — M5416 Radiculopathy, lumbar region: Secondary | ICD-10-CM

## 2015-11-13 DIAGNOSIS — M62830 Muscle spasm of back: Secondary | ICD-10-CM | POA: Diagnosis not present

## 2015-11-13 DIAGNOSIS — M47816 Spondylosis without myelopathy or radiculopathy, lumbar region: Secondary | ICD-10-CM

## 2015-11-13 MED ORDER — ORPHENADRINE CITRATE 30 MG/ML IJ SOLN
60.0000 mg | Freq: Once | INTRAMUSCULAR | Status: AC
  Administered 2015-11-13: 60 mg via INTRAMUSCULAR
  Filled 2015-11-13: qty 2

## 2015-11-13 MED ORDER — BUPIVACAINE HCL (PF) 0.25 % IJ SOLN
30.0000 mL | Freq: Once | INTRAMUSCULAR | Status: AC
  Administered 2015-11-13: 30 mL
  Filled 2015-11-13: qty 30

## 2015-11-13 MED ORDER — FENTANYL CITRATE (PF) 100 MCG/2ML IJ SOLN
100.0000 ug | Freq: Once | INTRAMUSCULAR | Status: AC
  Administered 2015-11-13: 100 ug via INTRAVENOUS
  Filled 2015-11-13: qty 2

## 2015-11-13 MED ORDER — LACTATED RINGERS IV SOLN: 1000.0000 mL | INTRAVENOUS | Status: DC

## 2015-11-13 MED ORDER — TRIAMCINOLONE ACETONIDE 40 MG/ML IJ SUSP
40.0000 mg | Freq: Once | INTRAMUSCULAR | Status: AC
  Administered 2015-11-13: 40 mg
  Filled 2015-11-13: qty 1

## 2015-11-13 MED ORDER — MIDAZOLAM HCL 5 MG/5ML IJ SOLN
5.0000 mg | Freq: Once | INTRAMUSCULAR | Status: AC
  Administered 2015-11-13: 5 mg via INTRAVENOUS
  Filled 2015-11-13: qty 5

## 2015-11-13 NOTE — Progress Notes (Signed)
Safety precautions to be maintained throughout the outpatient stay will include: orient to surroundings, keep bed in low position, maintain call bell within reach at all times, provide assistance with transfer out of bed and ambulation.  

## 2015-11-13 NOTE — Progress Notes (Signed)
        PROCEDURE PERFORMED:  Intercostal nerve block.  HISTORY OF PRESENT ILLNESS:  The patient is a 30 y.o. female who returns to the [facility] for further evaluation and treatment of pain involving the midportion of the back. The patient is with severe muscle spasms occurring in the thoracic region. The pain is aggravated by reaching and lifting pushing pulling maneuvers.  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 The risks, benefits, and expectations of the procedure have been discussed and explained to the patient who was understanding and in agreement with suggested treatment plan. We will proceed with interventional treatment as discussed.   DESCRIPTION OF PROCEDURE: Intercostal nerve block with IV Versed, IV fentanyl conscious sedation, EKG, blood pressure, pulse, capnography, and pulse oximetry monitoring. The procedure was performed with the patient in the prone position under fluoroscopic guidance.   Intercostal nerve block, left side: With the patient in the prone position, Betadine prep of proposed entry site was performed under fluoroscopic guidance with AP view of the thoracic spine. Under fluoroscopic guidance, a 22 -gauge needle was inserted to contact bone of the 11th rib on the left side after which the needle was repositioned at the inferior border of the 11th rib on the left side under fluoroscopic guidance. Following documentation of needle placement, at the inferior border of the 11th rib on the left side and negative aspiration, a total of 3 mL of 0.25% bupivacaine with Kenalog was injected for left side for 11th rib intercostal nerve block.   INTERCOSTAL NERVE BLOCKS AT T10, T9, T8, T7, and T6 LEVELS: The procedure was performed at these levels as was performed at the previous level, T 11, utilizing the same technique and under fluoroscopic guidance  Myoneural block injections of the trapezius  musculature region Following Betadine prep of proposed entry site a 22-gauge needle was inserted into the thoracic musculature region and following negative aspiration 2 cc of 0.25% bupivacaine with Norflex was injected for myoneural block injection site of the thoracic musculature region 4   The patient tolerated the procedure well  A total of 10 mg Kenalog was utilized for the procedure.   PLAN:   1. Medications: We will continue presently prescribed medications. Baclofen and hydrocodone acetaminophen 2. The patient is to follow up with primary care physician Dr. Tyrone SageLyerly for further evaluation of blood pressure and general medical condition as discussed. 3. Surgical evaluation. The patient will undergo further surgical evaluation as discussed 4. Neurological evaluation. May consider PNCV EMG studies and other studies 5. May consider the patient for additional studies pending response to treatment and follow-up evaluation. 6. May consider radiofrequency procedures, implantation type procedures and other treatment pending response to treatment and follow-up evaluation. 7. The patient has been advised to adhere to proper body mechanics and to call the Pain Management Center prior to scheduled return appointment should there be significant change in condition or have other concerns regarding condition prior to scheduled return appointment.  The patient is understanding and in agreement with suggested treatment plan.

## 2015-11-13 NOTE — Patient Instructions (Addendum)
PLAN    Continue present medications baclofen and  hydrocodone acetaminophen       NO OXYCODONE  F/U PCP Dr. Clint GuyLindley for evaliation of  BP and general medical  condition  F/U surgical evaluation.  Orthopedic evaluation of lower back and lower extremity pain as discussed  F/U neurological evaluation. May consider PNCV/EMG studies and other studies pending follow-up evaluations  May consider radiofrequency rhizolysis or intraspinal procedures pending response to present treatment and F/U evaluation  Patient to call Pain Management Center should patient have concerns prior to scheduled return appointment  Intercostal Nerve Block Patient Information  Description: The twelve intercostal nerves arise from the first thru twelfth thoracic nerve roots.  The nerve begins at the spine and wraps around the body, lying in a groove underneath each rib.  Each intercostal nerve innervates a specific strip of skin and body walk of the abdomen and chest.  Therefore, injuries of the chest wall or abdominal wall result in pain that is transmitted back to the brian via the intercostal nerves.  Examples of such injuries include rib fractures and incisions for lung and gall bladder surgery.  Occasionally, pain may persist long after an injury or surgical incision secondary to inflammation and irritation of the intercostal nerve.  The longstanding pain is known as intercostal neuralgia.  An intercostal nerve block is preformed to eliminate pain either temporarily or permanently.  A small needle is placed below the rib and local anesthetic (like Novocaine) and possibly steroid is injected.  Usually 2-4 intercostal nerves are blocked at a time depending on the problem.  The patient will experience a slight "pin-prick" sensation for each injection.  Shortly thereafter, the strip of skin that is innervated by the blocked intercostal nerve will feel numb.  Persistent pain that is only temporarily relieved with local  anesthetic may require a more permanent block. This procedure is called Cryoneurolysis and entails placing a small probe beneath the rib to freeze the nerve.  Conditions that may be treated by intercostal nerve blocks:   Rib fractures  Longstanding pain from surgery of the chest or abdomen (intercostal neuralgia)  Pain from chest tubes  Pain from trauma to the chest  Preparation for the injections:  1. Do not eat any solid food or dairy products within 8 hours of your appointment. 2. You may drink clear liquids up to 3 hours before appointment.  Clear liquids include water, black coffee, juice or soda.  No milk or cream please. 3. You may take your regular medication, including pain medications, with a sip of water before your appointment.  Diabetics should hold regular insulin (if take separately) and take 1/2 normal NPH dose the morning of the procedure.   Carry some sugar containing items with you to your appointment. 4. A driver must accompany you and be prepared to drive you home after your procedure. 5. Bring all your current medications with you. 6. An IV may be inserted and sedation may be given at the discretion of the physician. 7. A blood pressure cuff, EKG and other monitors will often be applied during the procedure.  Some patients may need to have extra oxygen administered for a short period. 8. You will be asked to provide medical information, including your allergies, prior to the procedure.  We must know immediately if you are taking blood thinners (like Coumadin/Warfarin) or if you are allergic to IV iodine contrast (dye). We must know if you could possible be pregnant.  Possible side-effects:  Bleeding from needle site  Infection (rare)  Nerve injury (rare)  Numbness & tingling of skin  Collapsed lung requiring chest tube (rare)  Local anesthetic toxicity (rare)  Light-headedness (temporary)  Pain at injection site (several days)  Decreased blood  pressure (temporary)  Shortness of breath  Jittery/shaking sensation (temporary)  Call if you experience:   Difficulty breathing or hives (go directly to the emergency room)  Redness, inflammation or drainage at the injection site  Severe pain at the site of the injection  Any new symptoms which are concerning   Please note:  Your pain may subside immediately but may return several hours after the injection.  Often, more than one injection is required to reduce the pain. Also, if several temporary blocks with local anesthetic are ineffective, a more permanent block with cryolysis may be necessary.  This will be discussed with you should this be the case.  If you have any questions, please call [phone] [facility]  Pain Management Discharge Instructions  General Discharge Instructions :  If you need to reach your doctor call: Monday-Friday 8:00 am - 4:00 pm at [phone] or toll free [phone].  After clinic hours [phone] to have operator reach doctor.  Bring all of your medication bottles to all your appointments in the pain clinic.  To cancel or reschedule your appointment with Pain Management please remember to call 24 hours in advance to avoid a fee.  Refer to the educational materials which you have been given on: General Risks, I had my Procedure. Discharge Instructions, Post Sedation.  Post Procedure Instructions:  The drugs you were given will stay in your system until tomorrow, so for the next 24 hours you should not drive, make any legal decisions or drink any alcoholic beverages.  You may eat anything you prefer, but it is better to start with liquids then soups and crackers, and gradually work up to solid foods.  Please notify your doctor immediately if you have any unusual bleeding, trouble breathing or pain that is not related to your normal pain.  Depending on the type of procedure that was done, some parts  of your body may feel week and/or numb.  This usually clears up by tonight or the next day.  Walk with the use of an assistive device or accompanied by an adult for the 24 hours.  You may use ice on the affected area for the first 24 hours.  Put ice in a Ziploc bag and cover with a towel and place against area 15 minutes on 15 minutes off.  You may switch to heat after 24 hours.

## 2015-11-14 NOTE — Telephone Encounter (Signed)
Left message- no answer. Instructed to call if needed.

## 2015-11-21 ENCOUNTER — Telehealth: Payer: Self-pay

## 2015-11-21 NOTE — Telephone Encounter (Signed)
Pt is having pain on her right hip that is going down her hip

## 2015-11-21 NOTE — Telephone Encounter (Signed)
Nurses, Please inform patient that she may go to the emergency department for evaluation if the symptoms are severe. Inform patient that we could also consider performing a procedure as well as proceed with further surgical evaluation as we have previously discussed.. The procedure would most likely need to be performed in the [place] office. Please let me know what patient wishes to do at this time. Thank you

## 2015-11-21 NOTE — Telephone Encounter (Signed)
Pain in right lower back and right hip. Began this morning, has gotten worse throughout the day.Pain is the same as before the procedure, and has returned.

## 2015-11-21 NOTE — Telephone Encounter (Signed)
Voicemail left for patient re; Dr Metta Clinesrisp response to either go to ED for evaluation if s/s are severe or plan for a possible procedure which will need to be completed in [place] office.

## 2015-11-26 ENCOUNTER — Telehealth: Payer: Self-pay | Admitting: *Deleted

## 2015-12-08 ENCOUNTER — Other Ambulatory Visit: Payer: Self-pay | Admitting: Pain Medicine

## 2015-12-09 ENCOUNTER — Other Ambulatory Visit: Payer: Self-pay | Admitting: Pain Medicine

## 2015-12-09 ENCOUNTER — Ambulatory Visit: Payer: Self-pay | Admitting: Pain Medicine

## 2016-01-13 ENCOUNTER — Other Ambulatory Visit: Payer: Self-pay | Admitting: Pain Medicine

## 2016-03-21 IMAGING — CT CT HEAD WO/W CM
3 of 4 series · 14 of 33 positions shown, 17 images · IV contrast (omnipaque)
Comparison: None.

CLINICAL DATA: Headache for 1 week, no trauma. Evaluate for dural
venous sinus thrombosis.

EXAM:
CT HEAD WITHOUT AND WITH CONTRAST
TECHNIQUE: Contiguous axial images were obtained from the base of the skull
through the vertex without and with intravenous contrast
CONTRAST:  75mL OMNIPAQUE IOHEXOL 300 MG/ML  SOLN

[Series 5: head w · axial · 0.42mm/px · z∈[-138,-16]mm · 6 of 149 slices shown, 8 images]
[im 14/149  soft-tissue]
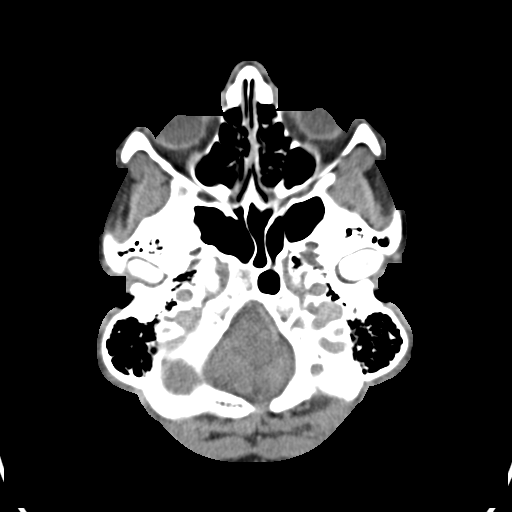
[im 14/149  bone]
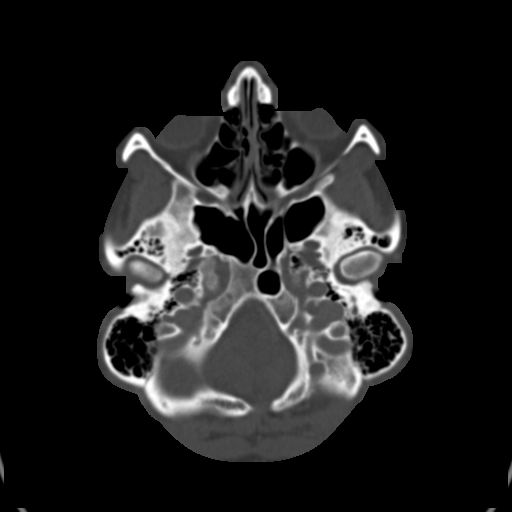
[im 41/149  bone]
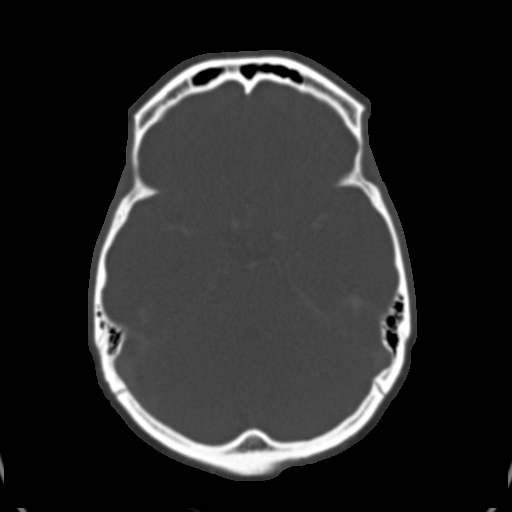
[im 68/149  bone]
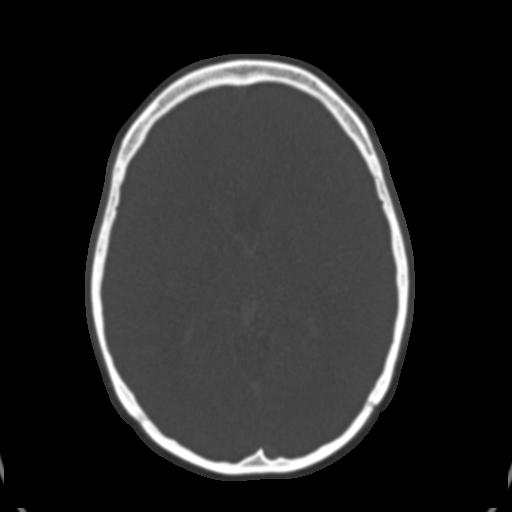
[im 81/149  bone]
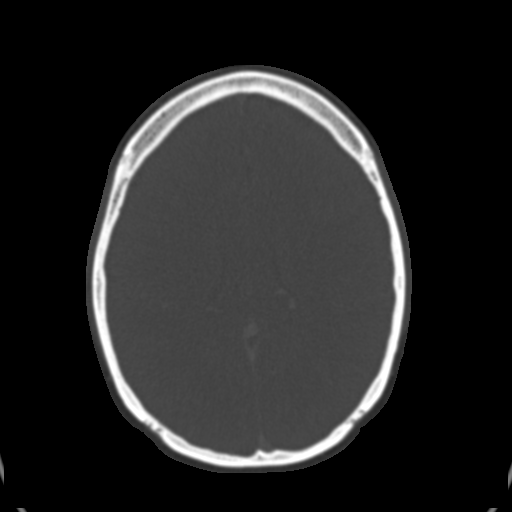
[im 108/149  soft-tissue]
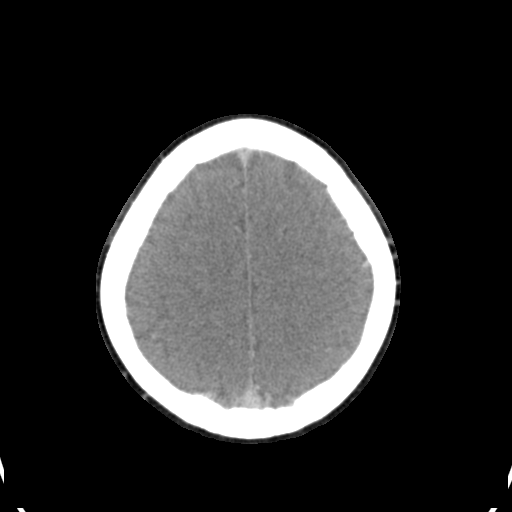
[im 108/149  bone]
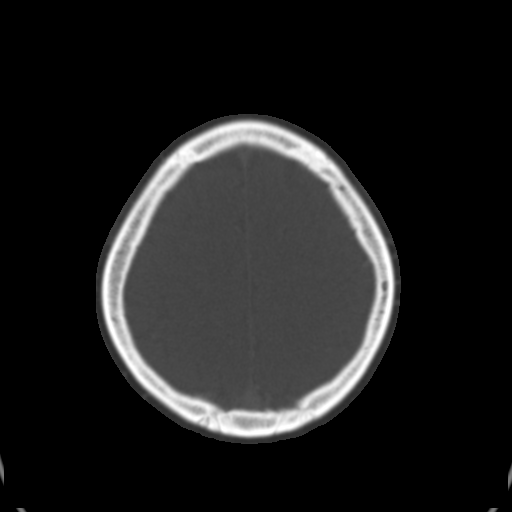
[im 135/149  bone]
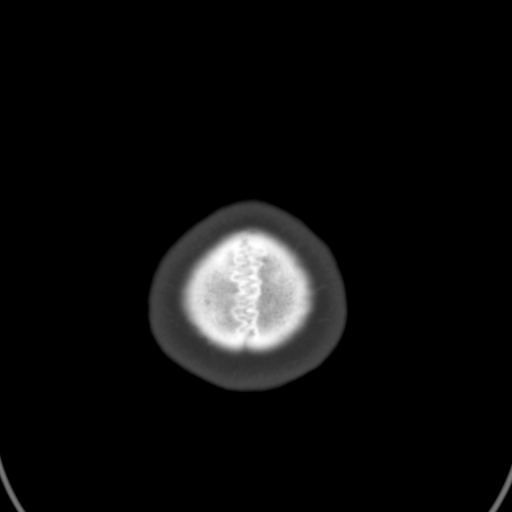

[Series 7: coronal · coronal · 0.31mm/px · 3 of 88 slices shown]
[im 18/88  bone]
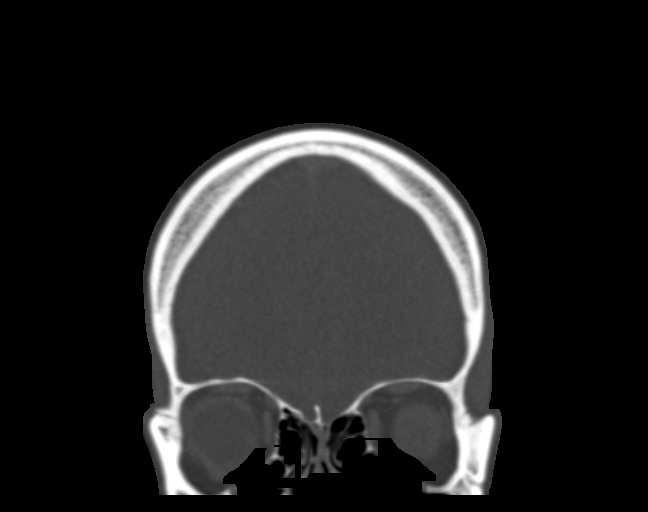
[im 35/88  bone]
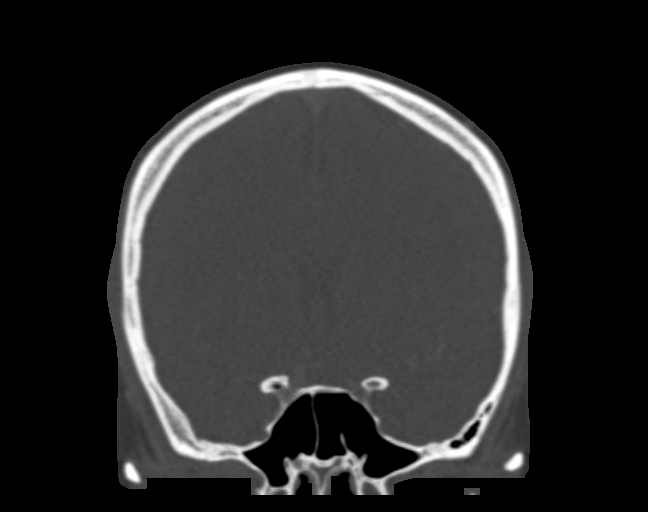
[im 53/88  bone]
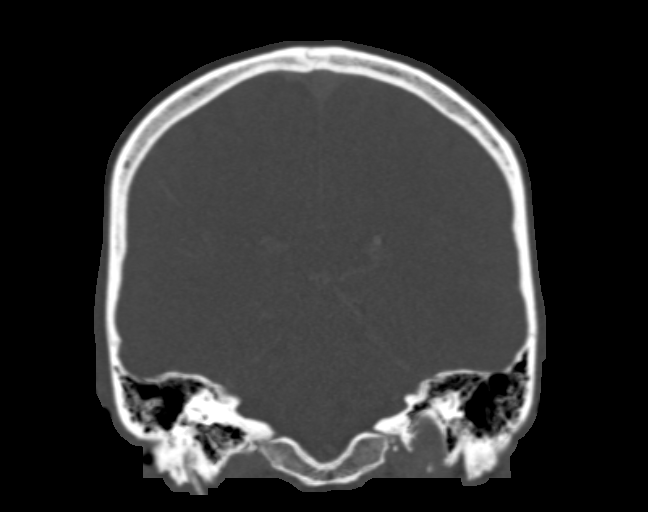

[Series 8: sagittal · sagittal · 0.30mm/px · 5 of 72 slices shown, 6 images]
[im 24/72  bone]
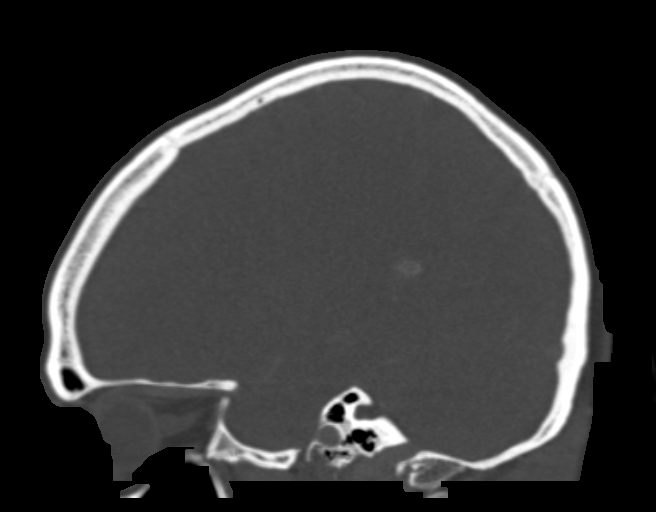
[im 30/72  bone]
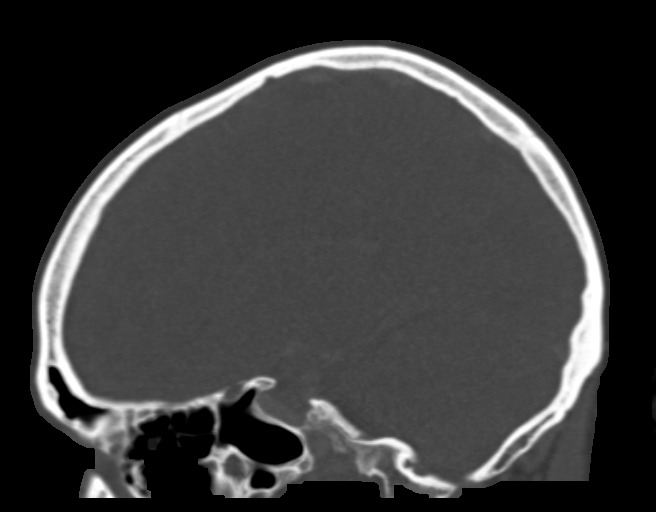
[im 36/72  soft-tissue]
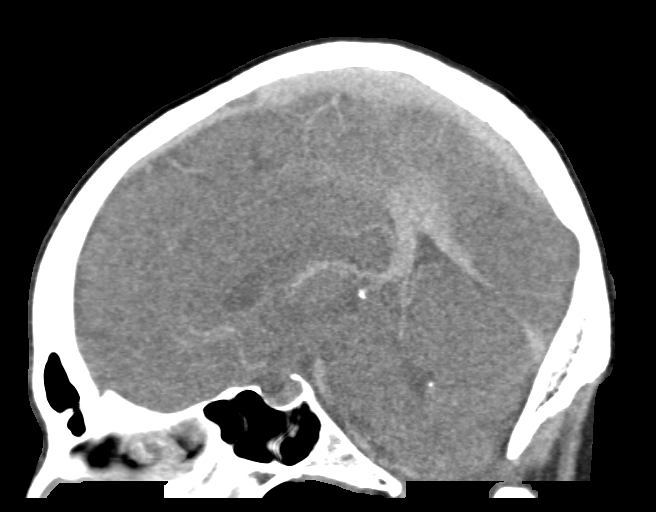
[im 36/72  bone]
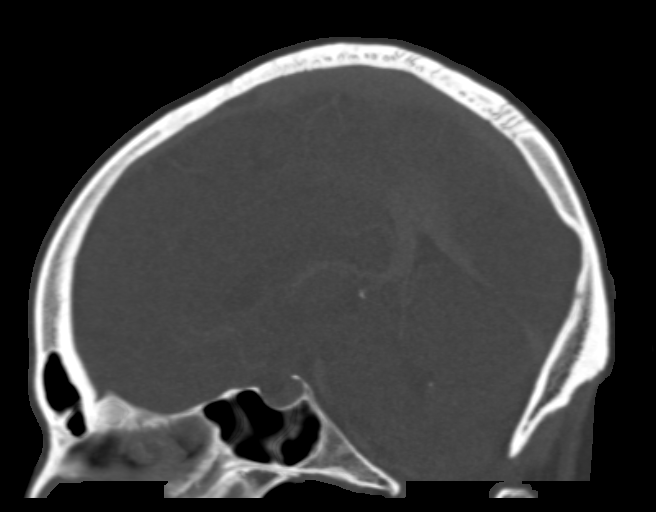
[im 42/72  bone]
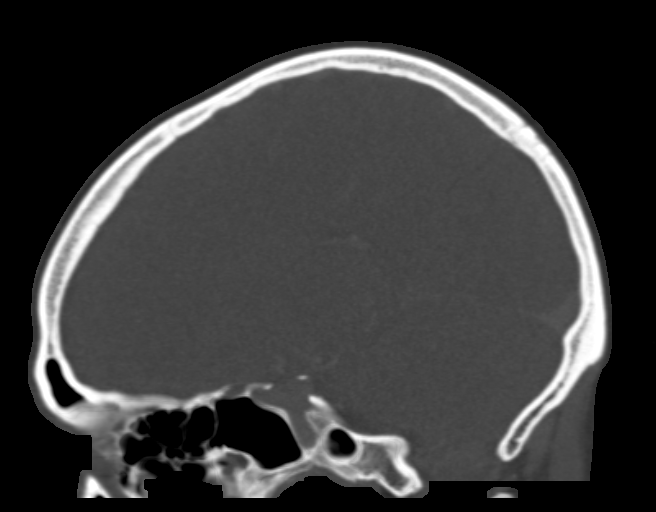
[im 48/72  bone]
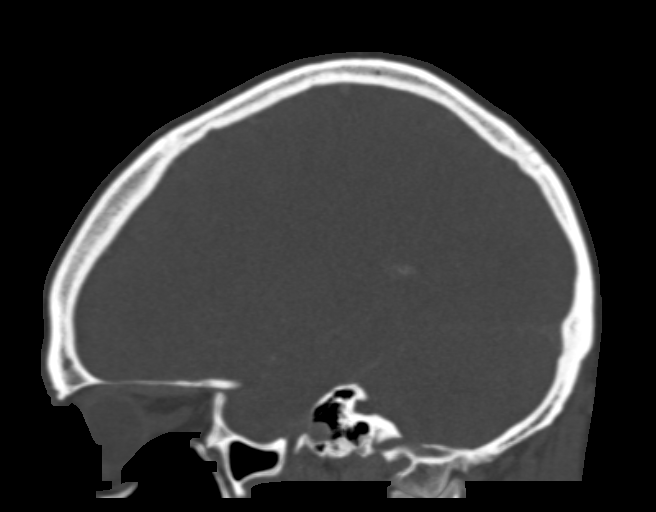

[14 of 33 positions shown; findings below may reference images not displayed]

FINDINGS: The ventricles and sulci are normal. No intraparenchymal hemorrhage,
mass effect nor midline shift. No acute large vascular territory
infarcts. No intrinsic density along the course of the major dural
venous sinuses.

No abnormal extra-axial fluid collections. Basal cisterns are
patent. Dural venous sinuses demonstrate homogeneous contrast
opacification without filling defect.

No skull fracture. The included ocular globes and orbital contents
are non-suspicious. The mastoid aircells and included paranasal
sinuses are well-aerated.
IMPRESSION: Normal CT head with and without contrast; no CT findings of dural
venous sinus thrombosis.

## 2016-07-16 ENCOUNTER — Ambulatory Visit (INDEPENDENT_AMBULATORY_CARE_PROVIDER_SITE_OTHER): Admitting: Behavioral Health

## 2016-07-16 ENCOUNTER — Encounter: Payer: Self-pay | Admitting: Behavioral Health

## 2016-07-16 DIAGNOSIS — F411 Generalized anxiety disorder: Secondary | ICD-10-CM

## 2016-07-16 DIAGNOSIS — F1990 Other psychoactive substance use, unspecified, uncomplicated: Secondary | ICD-10-CM | POA: Diagnosis not present

## 2016-07-16 NOTE — Progress Notes (Signed)
Comprehensive Clinical Assessment (CCA) Note  07/16/2016 Kerri Fuentes [id number]  Visit Diagnosis:      ICD-9-CM ICD-10-CM   1. Substance use disorder 305.90 F19.90   2. Generalized anxiety disorder 300.02 F41.1       CCA Part One  Part One has been completed on paper by the patient.  (See scanned document in Chart Review)  CCA Part Two A  Intake/Chief Complaint:  CCA Intake With Chief Complaint CCA Part Two Date: 07/16/16 CCA Part Two Time: 1812 Chief Complaint/Presenting Problem: pt was referred for substance abuse evaluation by Dr.Crisp due to testing positive for illicit drugs during pain management visit  Patients Currently Reported Symptoms/Problems: Pt reports anxiety with panic attacks x2 daily due to having her sister and her 5 kids in the house right now, pt denies illicit drug use and states that her pain is unbarable and it is causing her to be withdrawn and stay in her room.  Collateral Involvement: Pt lives with her husband her two children and her sister and her 5 children at the moment. Sister causes a lot of stress currently. Individual's Strengths: Has her own house and means to live independently Individual's Preferences: NA  Individual's Abilities: NA  Type of Services Patient Feels Are Needed: Pt states she is here because her pain management doctor is "making her do 2 sessions before he will start prescriping her medication again" She states that she is also having some anxiety  Initial Clinical Notes/Concerns: I suspect that pt is minimizing drug use due to the concern from Dr. Metta Clines. from the records that his office sent over it looks like she was positive for methamphetimines, cocaine and THC. Pt is only admitting to the marijuana and states that she has only smoked it once when her great grandparents died last month. Pt denies any substance abuse history.  Mental Health Symptoms Depression:  Depression: Change in energy/activity  Mania:      Anxiety:   Anxiety: Irritability, Restlessness  Psychosis:  Psychosis: N/A  Trauma:  Trauma: N/A  Obsessions:  Obsessions: N/A  Compulsions:  Compulsions: N/A  Inattention:  Inattention: Symptoms before age 76  Hyperactivity/Impulsivity:  Hyperactivity/Impulsivity: Fidgets with hands/feet  Oppositional/Defiant Behaviors:     Borderline Personality:  Emotional Irregularity: Intense/unstable relationships, Intense/inappropriate anger  Other Mood/Personality Symptoms:      Mental Status Exam Appearance and self-care  Stature:  Stature: Tall  Weight:  Weight: Thin  Clothing:  Clothing: Disheveled  Grooming:  Grooming: Normal  Cosmetic use:  Cosmetic Use: None  Posture/gait:  Posture/Gait: Normal  Motor activity:     Sensorium  Attention:  Attention: Normal  Concentration:  Concentration: Normal  Orientation:  Orientation: X5  Recall/memory:  Recall/Memory: Normal  Affect and Mood  Affect:  Affect: Appropriate  Mood:  Mood: Anxious  Relating  Eye contact:  Eye Contact: Normal  Facial expression:  Facial Expression: Responsive  Attitude toward examiner:  Attitude Toward Examiner: Manipulative  Thought and Language  Speech flow: Speech Flow: Normal  Thought content:  Thought Content: Appropriate to mood and circumstances  Preoccupation:     Hallucinations:     Organization:     Company secretary of Knowledge:  Fund of Knowledge: Average  Intelligence:  Intelligence: Average  Abstraction:  Abstraction: Normal  Judgement:  Judgement: Normal  Reality Testing:  Reality Testing: Realistic  Insight:  Insight: Denial  Decision Making:  Decision Making: Normal  Social Functioning  Social Maturity:  Social Maturity: Irresponsible, Self-centered  Social  Judgement:  Social Judgement: "Garment/textile technologist  Stress  Stressors:  Stressors: Family conflict  Coping Ability:  Coping Ability: Building surveyor Deficits:     Supports:      Family and Psychosocial History: Family  history Does patient have children?: Yes How many children?: 2 How is patient's relationship with their children?: children at 5 and 7  Childhood History:  Childhood History By whom was/is the patient raised?: Both parents Additional childhood history information: Dad was an Careers information officer  Description of patient's relationship with caregiver when they were a child: Dad was an alcoholic,  Patient's description of current relationship with people who raised him/her: NA  How were you disciplined when you got in trouble as a child/adolescent?: Spanked  Does patient have siblings?: Yes Number of Siblings: 2 Description of patient's current relationship with siblings: Sister is living with her but it is stressful and strained  Did patient suffer any verbal/emotional/physical/sexual abuse as a child?: No Did patient suffer from severe childhood neglect?: No Has patient ever been sexually abused/assaulted/raped as an adolescent or adult?: No Was the patient ever a victim of a crime or a disaster?: No Witnessed domestic violence?: No Has patient been effected by domestic violence as an adult?: No  CCA Part Two B  Employment/Work Situation: Employment / Work Psychologist, occupational Employment situation: On disability Why is patient on disability: Chronic back pain  How long has patient been on disability: unknown Patient's job has been impacted by current illness:  (NA pt is on disibility) Describe how patient's job has been impacted: NA  What is the longest time patient has a held a job?: NA Where was the patient employed at that time?: NA  Has patient ever been in the [employer]?: No (husband was in the [employer] ) Has patient ever served in combat?: No Did You Receive Any Psychiatric Treatment/Services While in Equities trader?: No Are There Guns or Other Weapons in Your Home?: No  Education: Engineer, civil (consulting) Currently Attending: None Name of [employer] School: NA  Did Garment/textile technologist From [employer]?: Yes Did  Theme park manager?: No Did Designer, television/film set?: No Did You Have An Individualized Education Program (IIEP): No Did You Have Any Difficulty At [employer]?: Yes (History of ADHD diagnosis )  Religion: Religion/Spirituality Are You A Religious Person?: No How Might This Affect Treatment?: None   Leisure/Recreation:    Exercise/Diet: Exercise/Diet Do You Exercise?: No Have You Gained or Lost A Significant Amount of Weight in the Past Six Months?: No Do You Follow a Special Diet?: No Do You Have Any Trouble Sleeping?: Yes Explanation of Sleeping Difficulties: yes keeps waking up   CCA Part Two C  Alcohol/Drug Use: Alcohol / Drug Use Pain Medications: Taking pain meds for chronic back pain- was referred here from doctor for SA evaluation because she was using illicit drugs as well.  Prescriptions: NA  History of alcohol / drug use?: Yes Longest period of sobriety (when/how long): NA  Substance #1 Name of Substance 1: Pt only admits to using marijuana once but tested positive for meth and cocaine at last visit at pain management doctor                    CCA Part Three  ASAM's:  Six Dimensions of Multidimensional Assessment  Dimension 1:  Acute Intoxication and/or Withdrawal Potential:     Dimension 2:  Biomedical Conditions and Complications:     Dimension 3:  Emotional, Behavioral, or Cognitive Conditions and Complications:  Dimension 4:  Readiness to Change:     Dimension 5:  Relapse, Continued use, or Continued Problem Potential:     Dimension 6:  Recovery/Living Environment:      Substance use Disorder (SUD) Substance Use Disorder (SUD)  Checklist Symptoms of Substance Use:  (Pt denies use but tested positive for meth, THC and cocaine )  Social Function:  Social Functioning Social Maturity: Risk analyst, Self-centered Social Judgement: "Chief of Staff"  Stress:  Stress Stressors: Family conflict Coping Ability: Overwhelmed Patient Takes Medications  The Way The Doctor Instructed?: No Priority Risk: Moderate Risk  Risk Assessment- Self-Harm Potential: Risk Assessment For Self-Harm Potential Thoughts of Self-Harm: No current thoughts Method: No plan Availability of Means: No access/NA  Risk Assessment -Dangerous to Others Potential: Risk Assessment For Dangerous to Others Potential Method: No Plan Availability of Means: No access or NA Intent: Vague intent or NA Notification Required: No need or identified person  DSM5 Diagnoses: Patient Active Problem List   Diagnosis Date Noted  . 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 Costochondritis 10/14/2015  . 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 DDD (degenerative disc disease), lumbar 12/27/2014  . 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 Facet syndrome, lumbar (HCC) 12/27/2014  . Lumbar radiculopathy 12/27/2014    Patient Centered Plan: Patient is on the following Treatment Plan(s):  Anxiety  Recommendations for Services/Supports/Treatments: Recommendations for Services/Supports/Treatments Recommendations For Services/Supports/Treatments: SAIOP (Substance Abuse Intensive Outpatient Program), Individual Therapy  Treatment Plan Summary: OP Treatment Plan Summary: Follow up with refering party  Referrals to Alternative Service(s): Referred to Alternative Service(s):   Place:   Date:   Time:    Referred to Alternative Service(s):   Place:   Date:   Time:    Referred to Alternative Service(s):   Place:   Date:   Time:    Referred to Alternative Service(s):   Place:   Date:   Time:     Jackie Russman LPC, LCASA

## 2016-07-16 NOTE — Progress Notes (Deleted)
   THERAPIST PROGRESS NOTE  Session Time: ***  Participation Level: {BHH PARTICIPATION LEVEL:22264}  Behavioral Response: {Appearance:22683}{BHH LEVEL OF CONSCIOUSNESS:22305}{BHH MOOD:22306}  Type of Therapy: {CHL AMB BH Type of Therapy:21022741}  Treatment Goals addressed: {CHL AMB BH Treatment Goals Addressed:21022754}  Interventions: {CHL AMB BH Type of Intervention:21022753}  Summary: Kerri Fuentes is a 31 y.o. female who presents with ***.   Suicidal/Homicidal: {BHH YES OR NO:22294}{yes/no/with/without intent/plan:22693}  Therapist Response: ***  Plan: Return again in *** weeks.  Diagnosis: Axis I: {psych axis 1:31909}    Axis II: {psych axis 2:31910}    Balbina Depace, Counselor 07/16/2016

## 2016-07-23 ENCOUNTER — Ambulatory Visit: Admitting: Behavioral Health

## 2016-08-03 ENCOUNTER — Ambulatory Visit (HOSPITAL_COMMUNITY): Payer: Self-pay | Admitting: Licensed Clinical Social Worker

## 2016-08-05 ENCOUNTER — Ambulatory Visit (INDEPENDENT_AMBULATORY_CARE_PROVIDER_SITE_OTHER): Admitting: Behavioral Health

## 2016-08-05 ENCOUNTER — Encounter: Payer: Self-pay | Admitting: Behavioral Health

## 2016-08-05 DIAGNOSIS — F411 Generalized anxiety disorder: Secondary | ICD-10-CM

## 2016-08-05 NOTE — Progress Notes (Signed)
   THERAPIST PROGRESS NOTE  Session Time: 1500-1600  Participation Level: Active  Behavioral Response: DisheveledAlertAnxious  Type of Therapy: Individual Therapy  Treatment Goals addressed: Anxiety  Interventions: CBT and Solution Focused  Summary: Kerri MoralesBrandy Leigh Fuentes is a 31 y.o. female who presents with anxiety and increased panic attacks over the past 2 months. She was originally referred by Dr. Metta Clinesrisp (pain management doctor) because she tested positive for illicit drugs and she was not allowed to go back to pain management before being seen by a therapist. Pt states that this was very upsetting to her because she "only smoked marijuana once" and it showed up on her screen. She states that she is looking for another doctor currently. Pt states that she is also enduring a lot of stress with her family- specifically her sisters who she feels like "think she is not a good person" and she ruminates about their relationship. She recently took her sister and her kids in and helped them transition to a new house. She states that she is resentful because they did not help her with bills and ended up moving into a large two story house and she is struggling to pay for food. Pt states that she feels like the stress is making her tense and she has had several panic attacks recently. She states that she has been more irritable with her children and is "on edge all the time".   Suicidal/Homicidal: Nowithout intent/plan  Therapist Response: Therapist discussed the stressors in her life that are leading to the anxiety and how she can start to cope with these overwhleming feelings and racing thoughts. Therapist asked her how she felt about herself and she stated that it was overall negative. Therapist helped her understand the importance in focusing on herself and having people in her life that "fill her up". She was instructed to take 15-30 minutes a day for some decompression time where her husband  could take the kids and she could have some time to herself where she could practice relaxation techniques. She agreed to this and states that she will try to implement guided meditation and breathing techniques into her daily routine.   Plan: Return again in 1-2weeks.  Diagnosis: Axis I: Generalized Anxiety Disorder    Axis II: Deferred    Vanetta Rule, Counselor LPC, LCAS 08/05/2016

## 2016-08-26 ENCOUNTER — Ambulatory Visit: Admitting: Behavioral Health

## 2016-08-31 ENCOUNTER — Ambulatory Visit: Admitting: Behavioral Health

## 2016-09-29 NOTE — Telephone Encounter (Signed)
NA

## 2016-11-02 ENCOUNTER — Ambulatory Visit: Admitting: Licensed Clinical Social Worker

## 2018-04-14 ENCOUNTER — Ambulatory Visit
Admission: RE | Admit: 2018-04-14 | Discharge: 2018-04-14 | Disposition: A | Discharge status: Home / Self Care | Payer: Medicaid Other | Attending: Family Medicine | Admitting: Family Medicine

## 2018-04-14 ENCOUNTER — Other Ambulatory Visit: Payer: Self-pay | Admitting: Family Medicine

## 2018-04-14 ENCOUNTER — Ambulatory Visit
Admission: RE | Admit: 2018-04-14 | Discharge: 2018-04-14 | Disposition: A | Discharge status: Home / Self Care | Payer: Medicaid Other | Source: Ambulatory Visit | Attending: Family Medicine | Admitting: Family Medicine

## 2018-04-14 DIAGNOSIS — M79672 Pain in left foot: Secondary | ICD-10-CM

## 2018-05-31 NOTE — H&P (Signed)
[name] is a 33 y.o. female here for Rf by Dr Sallye Ober King-desires BTL .pt G2 P2  Last SVD 2012 . + h/o PID with IV antibiotics .  No chronic pelvic pain  She states that she does not have a clotting d/o , only has clots with her cycle     Past Medical History:  has a past medical history of Abnormal cytology, Anxiety, Arthritis, Chronic pain, Fibromyalgia, Heart murmur, unspecified, Hypertension, Low back pain, Neck pain, and Pelvic cramping.  Past Surgical History:  has no past surgical history on file. Family History: family history includes Chronic pain in her father; Diverticulitis in her mother; Headaches in her mother; Low back pain in her father; Stroke in her maternal grandfather. Social History:  reports that she has been smoking. She has a 2.50 pack-year smoking history. She has never used smokeless tobacco. She reports that she does not drink alcohol or use drugs. OB/GYN History:          OB History    Gravida  2   Para  2   Term  2   Preterm      AB      Living  2     SAB      TAB      Ectopic      Molar      Multiple      Live Births  2          Allergies: is allergic to serotonin 5ht-3 antagonists; other; and tramadol. Medications:  Current Outpatient Medications:  .  baclofen (LIORESAL) 20 MG tablet, Take by mouth., Disp: , Rfl:  .  etonogestrel (NEXPLANON) 68 mg implant, 68 mg., Disp: , Rfl:  .  ibuprofen (ADVIL,MOTRIN) 600 MG tablet, , Disp: , Rfl:  .  meloxicam (MOBIC) 7.5 MG tablet, , Disp: , Rfl:  .  ciprofloxacin HCl (CIPRO) 250 MG tablet, Reported on 05/17/2015, Disp: , Rfl: 0 .  HYDROcodone-acetaminophen (NORCO) 10-325 mg tablet, Take 1 tablet by mouth every 6 (six) hours as needed for Pain. Earliest Fill Date: 01/03/15 (Patient not taking: Reported on 05/17/2015 ), Disp: 60 tablet, Rfl: 0 .  oxyCODONE (ROXICODONE) 5 MG immediate release tablet, Reported on 05/17/2015 , Disp: , Rfl:  .  traMADol (ULTRAM) 50 mg tablet, Take  1 tablet (50 mg total) by mouth every 6 (six) hours as needed for Pain for up to 60 doses. (Patient not taking: Reported on 05/10/2018  ), Disp: 30 tablet, Rfl: 1  Review of Systems: General:                      No fatigue or weight loss Eyes:                           No vision changes Ears:                            No hearing difficulty Respiratory:                No cough or shortness of breath Pulmonary:                  No asthma or shortness of breath Cardiovascular:           No chest pain, palpitations, dyspnea on exertion Gastrointestinal:          No abdominal bloating, chronic  diarrhea, constipations, masses, pain or hematochezia Genitourinary:             No hematuria, dysuria, abnormal vaginal discharge, pelvic pain, Menometrorrhagia Lymphatic:                   No swollen lymph nodes Musculoskeletal:         No muscle weakness Neurologic:                  No extremity weakness, syncope, seizure disorder Psychiatric:                  No history of depression, delusions or suicidal/homicidal ideation    Exam:      Vitals:   03/112/2020 1501  BP: 121/86  Pulse: 85    Body mass index is 24.02 kg/m.  WDWN white/ female in NAD   Lungs: CTA  CV : RRR without murmur    Neck:  no thyromegaly Abdomen: soft , no mass, normal active bowel sounds,  non-tender, no rebound tenderness Pelvic: tanner stage 5 ,  External genitalia: vulva /labia no lesions Urethra: no prolapse Vagina: normal physiologic d/c Cervix: no lesions, no cervical motion tenderness   Uterus: normal size shape and contour, non-tender Adnexa: no mass,  non-tender   Rectovaginal:  Impression:   The encounter diagnosis was Encounter for sterilization.    Plan:   She desires permanent sterilization Alternatives offered .  Medicaid consent signed today  Risks of the procedure discussed with the pt  Failure rate of 1/200/ year discussed      Return if symptoms worsen or  fail to improve, for preop.  Vilma Prader, MD

## 2018-06-02 ENCOUNTER — Inpatient Hospital Stay: Admission: RE | Admit: 2018-06-02 | Payer: Self-pay | Source: Ambulatory Visit

## 2018-06-06 ENCOUNTER — Encounter
Admission: RE | Admit: 2018-06-06 | Discharge: 2018-06-06 | Disposition: A | Discharge status: Home / Self Care | Payer: Medicaid Other | Source: Ambulatory Visit | Attending: Obstetrics and Gynecology | Admitting: Obstetrics and Gynecology

## 2018-06-06 ENCOUNTER — Other Ambulatory Visit: Payer: Self-pay

## 2018-06-06 DIAGNOSIS — Z01812 Encounter for preprocedural laboratory examination: Secondary | ICD-10-CM | POA: Diagnosis not present

## 2018-06-06 HISTORY — DX: Headache, unspecified: R51.9

## 2018-06-06 HISTORY — DX: Headache: R51

## 2018-06-06 HISTORY — DX: Personal history of urinary calculi: Z87.442

## 2018-06-06 LAB — BASIC METABOLIC PANEL
Anion gap: 7 (ref 5–15)
BUN: 10 mg/dL (ref 6–20)
CO2: 25 mmol/L (ref 22–32)
Calcium: 8.7 mg/dL — ABNORMAL LOW (ref 8.9–10.3)
Chloride: 104 mmol/L (ref 98–111)
Creatinine, Ser: 0.52 mg/dL (ref 0.44–1.00)
GFR calc Af Amer: >60 mL/min (ref >60)
Glucose, Bld: 98 mg/dL (ref 70–99)
Potassium: 3.5 mmol/L (ref 3.5–5.1)
Sodium: 136 mmol/L (ref 135–145)

## 2018-06-06 LAB — CBC
HCT: 35.4 % — ABNORMAL LOW (ref 36.0–46.0)
Hemoglobin: 11.6 g/dL — ABNORMAL LOW (ref 12.0–15.0)
MCH: 32.1 pg (ref 26.0–34.0)
MCHC: 32.8 g/dL (ref 30.0–36.0)
MCV: 98.1 fL (ref 80.0–100.0)
Platelets: 261 10*3/uL (ref 150–400)
RBC: 3.61 MIL/uL — ABNORMAL LOW (ref 3.87–5.11)
RDW: 13 % (ref 11.5–15.5)
WBC: 7.2 10*3/uL (ref 4.0–10.5)
nRBC: 0 % (ref 0.0–0.2)

## 2018-06-06 LAB — TYPE AND SCREEN
ABO/RH(D): O POS
Antibody Screen: NEGATIVE

## 2018-06-06 NOTE — Patient Instructions (Addendum)
Your procedure is scheduled on: Friday 06/10/18.  Report to DAY SURGERY DEPARTMENT LOCATED ON 2ND FLOOR MEDICAL MALL ENTRANCE. To find out your arrival time please call [phone] between 1PM - 3PM on Thursday 06/09/18.   Remember: Instructions that are not followed completely may result in serious medical risk, up to and including death, or upon the discretion of your surgeon and anesthesiologist your surgery may need to be rescheduled.      _X__ 1. Do not eat food after midnight the night before your procedure.                 No gum chewing or hard candies. You may drink clear liquids up to 2 hours                 before you are scheduled to arrive for your surgery- DO NOT drink clear                 liquids within 2 hours of the start of your surgery.                 Clear Liquids include:  water, apple juice without pulp, clear carbohydrate                 drink such as Clearfast or Gatorade, Black Coffee or Tea (Do not add                 milk or creamer to coffee or tea).   __X__ Dr. Feliberto Gottron wants you to drink an Ensure Pre-Surgery drink on the morning of your surgery. Please finish the drink two hours before your arrival time.  __X__2.  On the morning of surgery brush your teeth with toothpaste and water, you may rinse your mouth with mouthwash if you wish.  Do not swallow any toothpaste or mouthwash.       _X__ 3.  No Alcohol for 24 hours before or after surgery.    _X__ 4.  Do Not Smoke or use e-cigarettes For 24 Hours Prior to Your Surgery.                 Do not use any chewable tobacco products for at least 6 hours prior to                 surgery.    __X__6.  Notify your doctor if there is any change in your medical condition      (cold, fever, infections).       Do not wear jewelry, make-up, hairpins, clips or nail polish. Do not wear lotions, powders, or perfumes.  Do not shave 48 hours prior to surgery. Men may shave face and neck. Do not bring  valuables to the hospital.     [facility] is not responsible for any belongings or valuables.    Contacts, dentures/partials or body piercings may not be worn into surgery. Bring a case for your contacts, glasses or hearing aids, a denture cup will be supplied.     Patients discharged the day of surgery will not be allowed to drive home.     Please read over the following fact sheets that you were given:   MRSA Information    __X__ Take these medicines the morning of surgery with A SIP OF WATER:     1. amphetamine-dextroamphetamine (ADDERALL XR) 20 MG 24 hr capsule  2. HYDROcodone-acetaminophen (NORCO) 10-325 MG tablet  3. tiZANidine (ZANAFLEX) 4 MG capsule  __X__ Use CHG Soap as directed    __X__ Stop Anti-inflammatories 7 days before surgery such as Advil, Ibuprofen, Motrin, BC or Goodies Powder, Naprosyn, Naproxen, Aleve, Aspirin, Meloxicam. May take Tylenol and Hydrocodone if needed for pain or discomfort.     __X__ Do not begin taking any new herbal supplements until after your surgery.    __X__ Purchase a stool softener to take at home and Gas-ex. Have them ready to take when you get home.

## 2018-06-06 NOTE — Pre-Procedure Instructions (Signed)
Patient voiced extreme anxiety about surgery and concern that she will be very anxious on the morning of surgery. I messaged anesthesia on call. Will place IV early in pre-op for anti-anxiety med administration. RN to page anesthesia early.

## 2018-06-10 ENCOUNTER — Ambulatory Visit
Admission: RE | Admit: 2018-06-10 | Payer: Medicaid Other | Source: Home / Self Care | Admitting: Obstetrics and Gynecology

## 2018-06-10 ENCOUNTER — Encounter: Admission: RE | Payer: Self-pay | Source: Home / Self Care

## 2018-06-10 SURGERY — LIGATION, FALLOPIAN TUBE, LAPAROSCOPIC
Anesthesia: General | Laterality: Bilateral

## 2018-08-26 ENCOUNTER — Ambulatory Visit: Admit: 2018-08-26 | Payer: Medicaid Other | Admitting: Obstetrics and Gynecology

## 2018-08-26 SURGERY — LIGATION, FALLOPIAN TUBE, LAPAROSCOPIC
Anesthesia: General | Laterality: Bilateral

## 2019-01-21 ENCOUNTER — Ambulatory Visit: Admit: 2019-01-21 | Payer: Medicaid Other | Admitting: Obstetrics and Gynecology

## 2019-01-21 SURGERY — LIGATION, FALLOPIAN TUBE, LAPAROSCOPIC
Anesthesia: General | Laterality: Bilateral

## 2019-06-07 ENCOUNTER — Other Ambulatory Visit: Payer: Self-pay | Admitting: Family Medicine

## 2019-06-07 DIAGNOSIS — N6452 Nipple discharge: Secondary | ICD-10-CM

## 2019-06-07 DIAGNOSIS — N644 Mastodynia: Secondary | ICD-10-CM

## 2019-06-20 ENCOUNTER — Other Ambulatory Visit: Payer: Self-pay | Admitting: Family Medicine

## 2019-06-20 DIAGNOSIS — N644 Mastodynia: Secondary | ICD-10-CM

## 2019-06-20 DIAGNOSIS — N6452 Nipple discharge: Secondary | ICD-10-CM

## 2019-06-28 ENCOUNTER — Other Ambulatory Visit: Payer: Medicaid Other
